# Patient Record
Sex: Female | Born: 1950 | Race: Black or African American | Hispanic: No | Marital: Married | Smoking: Never smoker
Health system: Southern US, Community
[De-identification: ages and names within clinical notes are randomized; demographics above are authoritative.]

## PROBLEM LIST (undated history)

## (undated) DIAGNOSIS — Z9289 Personal history of other medical treatment: Secondary | ICD-10-CM

## (undated) DIAGNOSIS — M1990 Unspecified osteoarthritis, unspecified site: Secondary | ICD-10-CM

## (undated) DIAGNOSIS — E049 Nontoxic goiter, unspecified: Secondary | ICD-10-CM

## (undated) DIAGNOSIS — Z8619 Personal history of other infectious and parasitic diseases: Secondary | ICD-10-CM

## (undated) DIAGNOSIS — I10 Essential (primary) hypertension: Secondary | ICD-10-CM

## (undated) DIAGNOSIS — E119 Type 2 diabetes mellitus without complications: Secondary | ICD-10-CM

## (undated) HISTORY — PX: CHOLECYSTECTOMY: SHX55

## (undated) HISTORY — PX: APPENDECTOMY: SHX54

---

## 1977-06-15 HISTORY — PX: EXPLORATORY LAPAROTOMY: SUR591

## 1998-06-15 HISTORY — PX: OTHER SURGICAL HISTORY: SHX169

## 2009-06-15 HISTORY — PX: KNEE SURGERY: SHX244

## 2009-06-15 HISTORY — PX: DILATION AND CURETTAGE OF UTERUS: SHX78

## 2011-06-16 DIAGNOSIS — Z8619 Personal history of other infectious and parasitic diseases: Secondary | ICD-10-CM

## 2011-06-16 HISTORY — DX: Personal history of other infectious and parasitic diseases: Z86.19

## 2014-01-09 ENCOUNTER — Other Ambulatory Visit: Payer: Self-pay | Admitting: Orthopedic Surgery

## 2014-01-10 DIAGNOSIS — E049 Nontoxic goiter, unspecified: Secondary | ICD-10-CM

## 2014-01-10 HISTORY — DX: Nontoxic goiter, unspecified: E04.9

## 2014-01-17 ENCOUNTER — Encounter (HOSPITAL_COMMUNITY): Payer: Self-pay

## 2014-01-19 ENCOUNTER — Encounter (HOSPITAL_COMMUNITY)
Admission: RE | Admit: 2014-01-19 | Discharge: 2014-01-19 | Disposition: A | Discharge status: Home / Self Care | Payer: Medicare HMO | Source: Ambulatory Visit | Attending: Orthopedic Surgery | Admitting: Orthopedic Surgery

## 2014-01-19 ENCOUNTER — Encounter (HOSPITAL_COMMUNITY): Payer: Self-pay

## 2014-01-19 DIAGNOSIS — Z01812 Encounter for preprocedural laboratory examination: Secondary | ICD-10-CM | POA: Insufficient documentation

## 2014-01-19 DIAGNOSIS — Z01818 Encounter for other preprocedural examination: Secondary | ICD-10-CM | POA: Insufficient documentation

## 2014-01-19 HISTORY — DX: Unspecified osteoarthritis, unspecified site: M19.90

## 2014-01-19 HISTORY — DX: Personal history of other infectious and parasitic diseases: Z86.19

## 2014-01-19 HISTORY — DX: Nontoxic goiter, unspecified: E04.9

## 2014-01-19 HISTORY — DX: Type 2 diabetes mellitus without complications: E11.9

## 2014-01-19 HISTORY — DX: Personal history of other medical treatment: Z92.89

## 2014-01-19 LAB — APTT: aPTT: 31 seconds (ref 24–37)

## 2014-01-19 LAB — CBC WITH DIFFERENTIAL/PLATELET
Basophils Absolute: 0 10*3/uL (ref 0.0–0.1)
Basophils Relative: 0 % (ref 0–1)
Eosinophils Absolute: 0.2 10*3/uL (ref 0.0–0.7)
Eosinophils Relative: 2 % (ref 0–5)
HCT: 39.2 % (ref 36.0–46.0)
HEMOGLOBIN: 13.3 g/dL (ref 12.0–15.0)
LYMPHS ABS: 3.9 10*3/uL (ref 0.7–4.0)
LYMPHS PCT: 43 % (ref 12–46)
MCH: 29.2 pg (ref 26.0–34.0)
MCHC: 33.9 g/dL (ref 30.0–36.0)
MCV: 86 fL (ref 78.0–100.0)
Monocytes Absolute: 0.5 10*3/uL (ref 0.1–1.0)
Monocytes Relative: 5 % (ref 3–12)
NEUTROS PCT: 50 % (ref 43–77)
Neutro Abs: 4.6 10*3/uL (ref 1.7–7.7)
Platelets: 383 10*3/uL (ref 150–400)
RBC: 4.56 MIL/uL (ref 3.87–5.11)
RDW: 13.2 % (ref 11.5–15.5)
WBC: 9.2 10*3/uL (ref 4.0–10.5)

## 2014-01-19 LAB — URINALYSIS, ROUTINE W REFLEX MICROSCOPIC
Bilirubin Urine: NEGATIVE
GLUCOSE, UA: NEGATIVE mg/dL
Hgb urine dipstick: NEGATIVE
KETONES UR: NEGATIVE mg/dL
LEUKOCYTES UA: NEGATIVE
Nitrite: NEGATIVE
PH: 5 (ref 5.0–8.0)
Protein, ur: NEGATIVE mg/dL
Specific Gravity, Urine: 1.01 (ref 1.005–1.030)
Urobilinogen, UA: 0.2 mg/dL (ref 0.0–1.0)

## 2014-01-19 LAB — COMPREHENSIVE METABOLIC PANEL
ALT: 28 U/L (ref 0–35)
AST: 24 U/L (ref 0–37)
Albumin: 4.3 g/dL (ref 3.5–5.2)
Alkaline Phosphatase: 132 U/L — ABNORMAL HIGH (ref 39–117)
Anion gap: 14 (ref 5–15)
BUN: 10 mg/dL (ref 6–23)
CHLORIDE: 99 meq/L (ref 96–112)
CO2: 25 meq/L (ref 19–32)
Calcium: 11.6 mg/dL — ABNORMAL HIGH (ref 8.4–10.5)
Creatinine, Ser: 0.84 mg/dL (ref 0.50–1.10)
GFR calc Af Amer: 83 mL/min — ABNORMAL LOW (ref >90)
GFR, EST NON AFRICAN AMERICAN: 71 mL/min — AB (ref >90)
GLUCOSE: 134 mg/dL — AB (ref 70–99)
Potassium: 4.3 mEq/L (ref 3.7–5.3)
SODIUM: 138 meq/L (ref 137–147)
TOTAL PROTEIN: 8.2 g/dL (ref 6.0–8.3)
Total Bilirubin: 0.2 mg/dL — ABNORMAL LOW (ref 0.3–1.2)

## 2014-01-19 LAB — TYPE AND SCREEN
ABO/RH(D): O POS
Antibody Screen: NEGATIVE

## 2014-01-19 LAB — ABO/RH: ABO/RH(D): O POS

## 2014-01-19 LAB — PROTIME-INR
INR: 1.03 (ref 0.00–1.49)
Prothrombin Time: 13.5 seconds (ref 11.6–15.2)

## 2014-01-19 LAB — SURGICAL PCR SCREEN
MRSA, PCR: NEGATIVE
Staphylococcus aureus: NEGATIVE

## 2014-01-19 NOTE — Pre-Procedure Instructions (Signed)
Ashlee Rodriguez  01/19/2014   Your procedure is scheduled on:  01/29/2014  [facility]  [facility] A at  9:15 AM.  Call this number if you have problems the morning of surgery: [phone]   Remember:   Do not eat food or drink liquids after midnight.  On Sunday P.M.    Take these medicines the morning of surgery with A SIP OF WATER: Amlodipine, Clonidine   Do not wear jewelry, make-up or nail polish.  Do not wear lotions, powders, or perfumes. You may wear deodorant.  Do not shave 48 hours prior to surgery.   Do not bring valuables to the hospital.  [facility] is not responsible  for any belongings or valuables.               Contacts, dentures or bridgework may not be worn into surgery.  Leave suitcase in the car. After surgery it may be brought to your room.  For patients admitted to the hospital, discharge time is determined by your                treatment team.               Patients discharged the day of surgery will not be allowed to drive  home.  Name and phone number of your driver: /w spouse   Special Instructions: Special Instructions: [place] - Preparing for Surgery  Before surgery, you can play an important role.  Because skin is not sterile, your skin needs to be as free of germs as possible.  You can reduce the number of germs on you skin by washing with CHG (chlorahexidine gluconate) soap before surgery.  CHG is an antiseptic cleaner which kills germs and bonds with the skin to continue killing germs even after washing.  Please DO NOT use if you have an allergy to CHG or antibacterial soaps.  If your skin becomes reddened/irritated stop using the CHG and inform your nurse when you arrive at Short Stay.  Do not shave (including legs and underarms) for at least 48 hours prior to the first CHG shower.  You may shave your face.  Please follow these instructions carefully:   1.  Shower with CHG Soap the night before surgery and the   morning of Surgery.  2.  If you choose to wash your hair, wash your hair first as usual with your  normal shampoo.  3.  After you shampoo, rinse your hair and body thoroughly to remove the  Shampoo.  4.  Use CHG as you would any other liquid soap.  You can apply chg directly to the skin and wash gently with scrungie or a clean washcloth.  5.  Apply the CHG Soap to your body ONLY FROM THE NECK DOWN.    Do not use on open wounds or open sores.  Avoid contact with your eyes, ears, mouth and genitals (private parts).  Wash genitals (private parts)   with your normal soap.  6.  Wash thoroughly, paying special attention to the area where your surgery will be performed.  7.  Thoroughly rinse your body with warm water from the neck down.  8.  DO NOT shower/wash with your normal soap after using and rinsing off   the CHG Soap.  9.  Pat yourself dry with a clean towel.            10 .  Wear clean pajamas.  11.  Place clean sheets on your bed the night of your first shower and do not sleep with pets.  Day of Surgery  Do not apply any lotions/deodorants the morning of surgery.  Please wear clean clothes to the hospital/surgery center.   Please read over the following fact sheets that you were given: Pain Booklet, Coughing and Deep Breathing, Blood Transfusion Information, Total Joint Packet, MRSA Information and Surgical Site Infection Prevention

## 2014-01-21 LAB — URINE CULTURE: Colony Count: 60000

## 2014-01-22 ENCOUNTER — Other Ambulatory Visit (HOSPITAL_COMMUNITY): Payer: Self-pay

## 2014-01-28 MED ORDER — CHLORHEXIDINE GLUCONATE 4 % EX LIQD
60.0000 mL | Freq: Once | CUTANEOUS | Status: DC
  Filled 2014-01-28: qty 60

## 2014-01-28 MED ORDER — DEXTROSE 5 % IV SOLN
3.0000 g | INTRAVENOUS | Status: AC
  Administered 2014-01-29: 3 g via INTRAVENOUS
  Filled 2014-01-28: qty 3000

## 2014-01-28 MED ORDER — SODIUM CHLORIDE 0.9 % IV SOLN: INTRAVENOUS | Status: DC

## 2014-01-28 MED ORDER — TRANEXAMIC ACID 100 MG/ML IV SOLN
1000.0000 mg | INTRAVENOUS | Status: AC
  Administered 2014-01-29: 1000 mg via INTRAVENOUS
  Filled 2014-01-28: qty 10

## 2014-01-29 ENCOUNTER — Encounter (HOSPITAL_COMMUNITY): Payer: Medicare HMO | Admitting: Vascular Surgery

## 2014-01-29 ENCOUNTER — Encounter (HOSPITAL_COMMUNITY)
Admission: RE | Disposition: A | Discharge status: Home Health | Payer: Self-pay | Source: Ambulatory Visit | Attending: Orthopedic Surgery

## 2014-01-29 ENCOUNTER — Inpatient Hospital Stay (HOSPITAL_COMMUNITY)
Admission: RE | Admit: 2014-01-29 | Discharge: 2014-01-31 | DRG: 470 | Disposition: A | Discharge status: Home Health | Payer: Medicare HMO | Source: Ambulatory Visit | Attending: Orthopedic Surgery | Admitting: Orthopedic Surgery

## 2014-01-29 ENCOUNTER — Inpatient Hospital Stay (HOSPITAL_COMMUNITY): Payer: Medicare HMO | Admitting: Anesthesiology

## 2014-01-29 ENCOUNTER — Encounter (HOSPITAL_COMMUNITY): Payer: Self-pay | Admitting: *Deleted

## 2014-01-29 DIAGNOSIS — Z9089 Acquired absence of other organs: Secondary | ICD-10-CM

## 2014-01-29 DIAGNOSIS — E119 Type 2 diabetes mellitus without complications: Secondary | ICD-10-CM | POA: Diagnosis present

## 2014-01-29 DIAGNOSIS — M1710 Unilateral primary osteoarthritis, unspecified knee: Secondary | ICD-10-CM | POA: Diagnosis present

## 2014-01-29 DIAGNOSIS — R112 Nausea with vomiting, unspecified: Secondary | ICD-10-CM | POA: Diagnosis not present

## 2014-01-29 DIAGNOSIS — Z96652 Presence of left artificial knee joint: Secondary | ICD-10-CM

## 2014-01-29 DIAGNOSIS — D62 Acute posthemorrhagic anemia: Secondary | ICD-10-CM | POA: Diagnosis not present

## 2014-01-29 DIAGNOSIS — I10 Essential (primary) hypertension: Secondary | ICD-10-CM | POA: Diagnosis present

## 2014-01-29 DIAGNOSIS — Z6841 Body Mass Index (BMI) 40.0 and over, adult: Secondary | ICD-10-CM | POA: Diagnosis not present

## 2014-01-29 DIAGNOSIS — M25569 Pain in unspecified knee: Secondary | ICD-10-CM | POA: Diagnosis present

## 2014-01-29 DIAGNOSIS — Z96659 Presence of unspecified artificial knee joint: Secondary | ICD-10-CM

## 2014-01-29 HISTORY — PX: TOTAL KNEE ARTHROPLASTY: SHX125

## 2014-01-29 HISTORY — DX: Essential (primary) hypertension: I10

## 2014-01-29 LAB — GLUCOSE, CAPILLARY
Glucose-Capillary: 119 mg/dL — ABNORMAL HIGH (ref 70–99)
Glucose-Capillary: 177 mg/dL — ABNORMAL HIGH (ref 70–99)
Glucose-Capillary: 168 mg/dL — ABNORMAL HIGH (ref 70–99)
Glucose-Capillary: 199 mg/dL — ABNORMAL HIGH (ref 70–99)

## 2014-01-29 LAB — CBC
HEMATOCRIT: 39.5 % (ref 36.0–46.0)
Hemoglobin: 13.6 g/dL (ref 12.0–15.0)
MCH: 29.6 pg (ref 26.0–34.0)
MCHC: 34.4 g/dL (ref 30.0–36.0)
MCV: 86.1 fL (ref 78.0–100.0)
PLATELETS: 358 10*3/uL (ref 150–400)
RBC: 4.59 MIL/uL (ref 3.87–5.11)
RDW: 13.1 % (ref 11.5–15.5)
WBC: 18.6 10*3/uL — ABNORMAL HIGH (ref 4.0–10.5)

## 2014-01-29 LAB — CREATININE, SERUM
Creatinine, Ser: 0.79 mg/dL (ref 0.50–1.10)
GFR calc Af Amer: >90 mL/min (ref >90)
GFR calc non Af Amer: 86 mL/min — ABNORMAL LOW (ref >90)

## 2014-01-29 SURGERY — ARTHROPLASTY, KNEE, TOTAL
Anesthesia: Regional | Site: Knee | Laterality: Left

## 2014-01-29 MED ORDER — ENOXAPARIN SODIUM 30 MG/0.3ML ~~LOC~~ SOLN
30.0000 mg | Freq: Two times a day (BID) | SUBCUTANEOUS | Status: DC
  Administered 2014-01-30 – 2014-01-31 (×3): 30 mg via SUBCUTANEOUS
  Filled 2014-01-29 (×6): qty 0.3

## 2014-01-29 MED ORDER — BUPIVACAINE-EPINEPHRINE 0.5% -1:200000 IJ SOLN
INTRAMUSCULAR | Status: DC | PRN
  Administered 2014-01-29: 30 mL

## 2014-01-29 MED ORDER — FENTANYL CITRATE 0.05 MG/ML IJ SOLN
INTRAMUSCULAR | Status: AC
  Administered 2014-01-29: 75 ug
  Filled 2014-01-29: qty 2

## 2014-01-29 MED ORDER — ROCURONIUM BROMIDE 50 MG/5ML IV SOLN
INTRAVENOUS | Status: AC
  Filled 2014-01-29: qty 1

## 2014-01-29 MED ORDER — METOCLOPRAMIDE HCL 5 MG/ML IJ SOLN: 5.0000 mg | Freq: Three times a day (TID) | INTRAMUSCULAR | Status: DC | PRN

## 2014-01-29 MED ORDER — SODIUM CHLORIDE 0.9 % IV SOLN
INTRAVENOUS | Status: DC
  Administered 2014-01-29: 18:00:00 via INTRAVENOUS

## 2014-01-29 MED ORDER — ALUM & MAG HYDROXIDE-SIMETH 200-200-20 MG/5ML PO SUSP: 30.0000 mL | ORAL | Status: DC | PRN

## 2014-01-29 MED ORDER — FENTANYL CITRATE 0.05 MG/ML IJ SOLN
25.0000 ug | INTRAMUSCULAR | Status: DC | PRN
Start: 2014-01-29 — End: 2014-01-29
  Administered 2014-01-29 (×2): 50 ug via INTRAVENOUS

## 2014-01-29 MED ORDER — METOCLOPRAMIDE HCL 10 MG PO TABS: 5.0000 mg | ORAL_TABLET | Freq: Three times a day (TID) | ORAL | Status: DC | PRN

## 2014-01-29 MED ORDER — INSULIN ASPART 100 UNIT/ML ~~LOC~~ SOLN: 0.0000 [IU] | Freq: Every day | SUBCUTANEOUS | Status: DC

## 2014-01-29 MED ORDER — SENNOSIDES-DOCUSATE SODIUM 8.6-50 MG PO TABS: 1.0000 | ORAL_TABLET | Freq: Every evening | ORAL | Status: DC | PRN

## 2014-01-29 MED ORDER — AMLODIPINE BESYLATE 10 MG PO TABS
10.0000 mg | ORAL_TABLET | Freq: Every day | ORAL | Status: DC
  Administered 2014-01-30 – 2014-01-31 (×2): 10 mg via ORAL
  Filled 2014-01-29 (×3): qty 1

## 2014-01-29 MED ORDER — LABETALOL HCL 5 MG/ML IV SOLN
INTRAVENOUS | Status: DC | PRN
  Administered 2014-01-29 (×2): 5 mg via INTRAVENOUS

## 2014-01-29 MED ORDER — FENTANYL CITRATE 0.05 MG/ML IJ SOLN
INTRAMUSCULAR | Status: AC
  Administered 2014-01-29: 50 ug via INTRAVENOUS
  Filled 2014-01-29: qty 2

## 2014-01-29 MED ORDER — MENTHOL 3 MG MT LOZG: 1.0000 | LOZENGE | OROMUCOSAL | Status: DC | PRN

## 2014-01-29 MED ORDER — ZOLPIDEM TARTRATE 5 MG PO TABS: 5.0000 mg | ORAL_TABLET | Freq: Every evening | ORAL | Status: DC | PRN

## 2014-01-29 MED ORDER — BUPIVACAINE-EPINEPHRINE (PF) 0.5% -1:200000 IJ SOLN
INTRAMUSCULAR | Status: AC
  Filled 2014-01-29: qty 30

## 2014-01-29 MED ORDER — OXYCODONE HCL ER 10 MG PO T12A
10.0000 mg | EXTENDED_RELEASE_TABLET | Freq: Two times a day (BID) | ORAL | Status: DC
  Administered 2014-01-29 – 2014-01-31 (×4): 10 mg via ORAL
  Filled 2014-01-29 (×4): qty 1

## 2014-01-29 MED ORDER — BISACODYL 5 MG PO TBEC: 5.0000 mg | DELAYED_RELEASE_TABLET | Freq: Every day | ORAL | Status: DC | PRN

## 2014-01-29 MED ORDER — DIPHENHYDRAMINE HCL 50 MG/ML IJ SOLN
INTRAMUSCULAR | Status: DC | PRN
  Administered 2014-01-29: 12.5 mg via INTRAVENOUS

## 2014-01-29 MED ORDER — SCOPOLAMINE 1 MG/3DAYS TD PT72
1.0000 | MEDICATED_PATCH | TRANSDERMAL | Status: DC
  Administered 2014-01-29: 1 via TRANSDERMAL

## 2014-01-29 MED ORDER — GLIMEPIRIDE 4 MG PO TABS
4.0000 mg | ORAL_TABLET | Freq: Two times a day (BID) | ORAL | Status: DC
  Administered 2014-01-29 – 2014-01-31 (×4): 4 mg via ORAL
  Filled 2014-01-29 (×7): qty 1

## 2014-01-29 MED ORDER — NEOSTIGMINE METHYLSULFATE 10 MG/10ML IV SOLN
INTRAVENOUS | Status: AC
Start: 2014-01-29 — End: 2014-01-29
  Filled 2014-01-29: qty 1

## 2014-01-29 MED ORDER — CELECOXIB 200 MG PO CAPS
200.0000 mg | ORAL_CAPSULE | Freq: Two times a day (BID) | ORAL | Status: DC
  Administered 2014-01-29 – 2014-01-31 (×4): 200 mg via ORAL
  Filled 2014-01-29 (×6): qty 1

## 2014-01-29 MED ORDER — OXYCODONE HCL 5 MG PO TABS
5.0000 mg | ORAL_TABLET | ORAL | Status: DC | PRN
  Administered 2014-01-29: 5 mg via ORAL
  Administered 2014-01-30: 10 mg via ORAL
  Administered 2014-01-30: 5 mg via ORAL
  Administered 2014-01-30 – 2014-01-31 (×6): 10 mg via ORAL
  Filled 2014-01-29 (×5): qty 2
  Filled 2014-01-29: qty 1
  Filled 2014-01-29 (×2): qty 2

## 2014-01-29 MED ORDER — FENTANYL CITRATE 0.05 MG/ML IJ SOLN
INTRAMUSCULAR | Status: AC
  Filled 2014-01-29: qty 5

## 2014-01-29 MED ORDER — ALBUTEROL SULFATE HFA 108 (90 BASE) MCG/ACT IN AERS
INHALATION_SPRAY | RESPIRATORY_TRACT | Status: AC
  Filled 2014-01-29: qty 6.7

## 2014-01-29 MED ORDER — GLYCOPYRROLATE 0.2 MG/ML IJ SOLN
INTRAMUSCULAR | Status: DC | PRN
  Administered 2014-01-29: 0.6 mg via INTRAVENOUS
  Administered 2014-01-29: 0.2 mg via INTRAVENOUS

## 2014-01-29 MED ORDER — CLONIDINE HCL 0.3 MG PO TABS
0.3000 mg | ORAL_TABLET | Freq: Two times a day (BID) | ORAL | Status: DC
  Administered 2014-01-29 – 2014-01-31 (×4): 0.3 mg via ORAL
  Filled 2014-01-29 (×5): qty 1

## 2014-01-29 MED ORDER — FENTANYL CITRATE 0.05 MG/ML IJ SOLN
INTRAMUSCULAR | Status: DC | PRN
  Administered 2014-01-29: 50 ug via INTRAVENOUS
  Administered 2014-01-29: 250 ug via INTRAVENOUS
  Administered 2014-01-29 (×2): 100 ug via INTRAVENOUS

## 2014-01-29 MED ORDER — SIMVASTATIN 10 MG PO TABS
10.0000 mg | ORAL_TABLET | Freq: Every day | ORAL | Status: DC
Start: 2014-01-29 — End: 2014-01-31
  Administered 2014-01-29 – 2014-01-30 (×2): 10 mg via ORAL
  Filled 2014-01-29 (×3): qty 1

## 2014-01-29 MED ORDER — ATROPINE SULFATE 1 MG/ML IJ SOLN
INTRAMUSCULAR | Status: DC | PRN
  Administered 2014-01-29: .3 mg via INTRAVENOUS

## 2014-01-29 MED ORDER — LIDOCAINE HCL (CARDIAC) 20 MG/ML IV SOLN
INTRAVENOUS | Status: DC | PRN
  Administered 2014-01-29: 30 mg via INTRAVENOUS

## 2014-01-29 MED ORDER — OXYCODONE HCL 5 MG PO TABS
ORAL_TABLET | ORAL | Status: AC
  Administered 2014-01-29: 5 mg via ORAL
  Filled 2014-01-29: qty 1

## 2014-01-29 MED ORDER — PROMETHAZINE HCL 25 MG/ML IJ SOLN: 6.2500 mg | INTRAMUSCULAR | Status: DC | PRN

## 2014-01-29 MED ORDER — ONDANSETRON HCL 4 MG/2ML IJ SOLN
4.0000 mg | Freq: Four times a day (QID) | INTRAMUSCULAR | Status: DC | PRN
  Administered 2014-01-30: 4 mg via INTRAVENOUS
  Filled 2014-01-29: qty 2

## 2014-01-29 MED ORDER — EPHEDRINE SULFATE 50 MG/ML IJ SOLN
INTRAMUSCULAR | Status: DC | PRN
  Administered 2014-01-29: 15 mg via INTRAVENOUS
  Administered 2014-01-29: 10 mg via INTRAVENOUS

## 2014-01-29 MED ORDER — ACETAMINOPHEN 325 MG PO TABS: 650.0000 mg | ORAL_TABLET | Freq: Four times a day (QID) | ORAL | Status: DC | PRN

## 2014-01-29 MED ORDER — LACTATED RINGERS IV SOLN
INTRAVENOUS | Status: DC | PRN
  Administered 2014-01-29: 11:00:00 via INTRAVENOUS

## 2014-01-29 MED ORDER — DOCUSATE SODIUM 100 MG PO CAPS
100.0000 mg | ORAL_CAPSULE | Freq: Two times a day (BID) | ORAL | Status: DC
  Administered 2014-01-29 – 2014-01-31 (×4): 100 mg via ORAL
  Filled 2014-01-29 (×6): qty 1

## 2014-01-29 MED ORDER — MIDAZOLAM HCL 2 MG/2ML IJ SOLN
INTRAMUSCULAR | Status: AC
  Administered 2014-01-29: 1.5 mg
  Filled 2014-01-29: qty 2

## 2014-01-29 MED ORDER — ATROPINE SULFATE 0.1 MG/ML IJ SOLN
INTRAMUSCULAR | Status: AC
  Filled 2014-01-29: qty 10

## 2014-01-29 MED ORDER — SODIUM CHLORIDE 0.9 % IR SOLN
Status: DC | PRN
  Administered 2014-01-29: 1

## 2014-01-29 MED ORDER — LIDOCAINE HCL (CARDIAC) 20 MG/ML IV SOLN
INTRAVENOUS | Status: AC
  Filled 2014-01-29: qty 5

## 2014-01-29 MED ORDER — MIDAZOLAM HCL 2 MG/2ML IJ SOLN
INTRAMUSCULAR | Status: AC
  Filled 2014-01-29: qty 2

## 2014-01-29 MED ORDER — LABETALOL HCL 5 MG/ML IV SOLN
INTRAVENOUS | Status: AC
  Filled 2014-01-29: qty 4

## 2014-01-29 MED ORDER — PROPOFOL 10 MG/ML IV BOLUS
INTRAVENOUS | Status: AC
  Filled 2014-01-29: qty 20

## 2014-01-29 MED ORDER — WHITE PETROLATUM GEL
Status: AC
  Administered 2014-01-29: 23:00:00
  Filled 2014-01-29: qty 5

## 2014-01-29 MED ORDER — DIPHENHYDRAMINE HCL 12.5 MG/5ML PO ELIX
12.5000 mg | ORAL_SOLUTION | ORAL | Status: DC | PRN
Start: 2014-01-29 — End: 2014-01-31

## 2014-01-29 MED ORDER — CINACALCET HCL 30 MG PO TABS
30.0000 mg | ORAL_TABLET | Freq: Two times a day (BID) | ORAL | Status: DC
  Administered 2014-01-29 – 2014-01-31 (×4): 30 mg via ORAL
  Filled 2014-01-29 (×7): qty 1

## 2014-01-29 MED ORDER — BUPIVACAINE LIPOSOME 1.3 % IJ SUSP
20.0000 mL | Freq: Once | INTRAMUSCULAR | Status: AC
  Administered 2014-01-29: 20 mL
  Filled 2014-01-29: qty 20

## 2014-01-29 MED ORDER — CEFAZOLIN SODIUM-DEXTROSE 2-3 GM-% IV SOLR
2.0000 g | Freq: Four times a day (QID) | INTRAVENOUS | Status: AC
  Administered 2014-01-29 (×2): 2 g via INTRAVENOUS
  Filled 2014-01-29 (×2): qty 50

## 2014-01-29 MED ORDER — METHOCARBAMOL 1000 MG/10ML IJ SOLN
500.0000 mg | Freq: Four times a day (QID) | INTRAMUSCULAR | Status: DC | PRN
  Filled 2014-01-29: qty 5

## 2014-01-29 MED ORDER — ONDANSETRON HCL 4 MG PO TABS: 4.0000 mg | ORAL_TABLET | Freq: Four times a day (QID) | ORAL | Status: DC | PRN

## 2014-01-29 MED ORDER — METHOCARBAMOL 500 MG PO TABS
500.0000 mg | ORAL_TABLET | Freq: Four times a day (QID) | ORAL | Status: DC | PRN
  Administered 2014-01-30 – 2014-01-31 (×4): 500 mg via ORAL
  Filled 2014-01-29 (×4): qty 1

## 2014-01-29 MED ORDER — ROCURONIUM BROMIDE 100 MG/10ML IV SOLN
INTRAVENOUS | Status: DC | PRN
  Administered 2014-01-29: 50 mg via INTRAVENOUS

## 2014-01-29 MED ORDER — DIPHENHYDRAMINE HCL 50 MG/ML IJ SOLN
INTRAMUSCULAR | Status: AC
  Filled 2014-01-29: qty 1

## 2014-01-29 MED ORDER — DEXAMETHASONE SODIUM PHOSPHATE 4 MG/ML IJ SOLN
INTRAMUSCULAR | Status: DC | PRN
  Administered 2014-01-29: 4 mg via INTRAVENOUS

## 2014-01-29 MED ORDER — PROPOFOL 10 MG/ML IV BOLUS
INTRAVENOUS | Status: DC | PRN
  Administered 2014-01-29: 200 mg via INTRAVENOUS

## 2014-01-29 MED ORDER — INSULIN ASPART 100 UNIT/ML ~~LOC~~ SOLN
0.0000 [IU] | Freq: Three times a day (TID) | SUBCUTANEOUS | Status: DC
  Administered 2014-01-29 – 2014-01-30 (×2): 3 [IU] via SUBCUTANEOUS
  Administered 2014-01-30: 5 [IU] via SUBCUTANEOUS
  Administered 2014-01-30 – 2014-01-31 (×3): 3 [IU] via SUBCUTANEOUS

## 2014-01-29 MED ORDER — LINAGLIPTIN 5 MG PO TABS
5.0000 mg | ORAL_TABLET | Freq: Every day | ORAL | Status: DC
  Administered 2014-01-29 – 2014-01-31 (×3): 5 mg via ORAL
  Filled 2014-01-29 (×3): qty 1

## 2014-01-29 MED ORDER — FLEET ENEMA 7-19 GM/118ML RE ENEM
1.0000 | ENEMA | Freq: Once | RECTAL | Status: AC | PRN
Start: 2014-01-29 — End: 2014-01-29

## 2014-01-29 MED ORDER — LACTATED RINGERS IV SOLN
INTRAVENOUS | Status: DC
  Administered 2014-01-29: 10:00:00 via INTRAVENOUS

## 2014-01-29 MED ORDER — MEPERIDINE HCL 25 MG/ML IJ SOLN: 6.2500 mg | INTRAMUSCULAR | Status: DC | PRN

## 2014-01-29 MED ORDER — GLYCOPYRROLATE 0.2 MG/ML IJ SOLN
INTRAMUSCULAR | Status: AC
  Filled 2014-01-29: qty 3

## 2014-01-29 MED ORDER — METFORMIN HCL 500 MG PO TABS
1000.0000 mg | ORAL_TABLET | Freq: Two times a day (BID) | ORAL | Status: DC
  Administered 2014-01-29 – 2014-01-31 (×4): 1000 mg via ORAL
  Filled 2014-01-29 (×6): qty 2

## 2014-01-29 MED ORDER — ONDANSETRON HCL 4 MG/2ML IJ SOLN
INTRAMUSCULAR | Status: AC
  Filled 2014-01-29: qty 2

## 2014-01-29 MED ORDER — ACETAMINOPHEN 650 MG RE SUPP: 650.0000 mg | Freq: Four times a day (QID) | RECTAL | Status: DC | PRN

## 2014-01-29 MED ORDER — DEXAMETHASONE SODIUM PHOSPHATE 4 MG/ML IJ SOLN
INTRAMUSCULAR | Status: AC
  Filled 2014-01-29: qty 1

## 2014-01-29 MED ORDER — ONDANSETRON HCL 4 MG/2ML IJ SOLN
INTRAMUSCULAR | Status: DC | PRN
  Administered 2014-01-29: 4 mg via INTRAVENOUS

## 2014-01-29 MED ORDER — HYDROMORPHONE HCL PF 1 MG/ML IJ SOLN
1.0000 mg | INTRAMUSCULAR | Status: DC | PRN
  Administered 2014-01-29 – 2014-01-30 (×4): 1 mg via INTRAVENOUS
  Filled 2014-01-29 (×4): qty 1

## 2014-01-29 MED ORDER — QUINAPRIL HCL 10 MG PO TABS
40.0000 mg | ORAL_TABLET | Freq: Every day | ORAL | Status: DC
  Administered 2014-01-30 – 2014-01-31 (×2): 40 mg via ORAL
  Filled 2014-01-29 (×4): qty 4

## 2014-01-29 MED ORDER — PHENOL 1.4 % MT LIQD: 1.0000 | OROMUCOSAL | Status: DC | PRN

## 2014-01-29 MED ORDER — NEOSTIGMINE METHYLSULFATE 10 MG/10ML IV SOLN
INTRAVENOUS | Status: DC | PRN
  Administered 2014-01-29: 4 mg via INTRAVENOUS

## 2014-01-29 SURGICAL SUPPLY — 57 items
BANDAGE ESMARK 6X9 LF (GAUZE/BANDAGES/DRESSINGS) ×1 IMPLANT
BLADE SAGITTAL 13X1.27X60 (BLADE) ×2 IMPLANT
BLADE SAGITTAL 13X1.27X60MM (BLADE) ×1
BLADE SAW SGTL 83.5X18.5 (BLADE) ×3 IMPLANT
BNDG ELASTIC 6X15 VLCR STRL LF (GAUZE/BANDAGES/DRESSINGS) ×3 IMPLANT
BNDG ESMARK 6X9 LF (GAUZE/BANDAGES/DRESSINGS) ×3
BOWL SMART MIX CTS (DISPOSABLE) ×3 IMPLANT
CAP POR NKTM CP VIT E LN CER ×3 IMPLANT
CEMENT BONE SIMPLEX SPEEDSET (Cement) ×6 IMPLANT
COVER SURGICAL LIGHT HANDLE (MISCELLANEOUS) ×3 IMPLANT
CUFF TOURNIQUET SINGLE 34IN LL (TOURNIQUET CUFF) ×3 IMPLANT
DRAPE EXTREMITY T 121X128X90 (DRAPE) ×3 IMPLANT
DRAPE INCISE IOBAN 66X45 STRL (DRAPES) ×6 IMPLANT
DRAPE PROXIMA HALF (DRAPES) ×3 IMPLANT
DRAPE U-SHAPE 47X51 STRL (DRAPES) ×3 IMPLANT
DRSG ADAPTIC 3X8 NADH LF (GAUZE/BANDAGES/DRESSINGS) ×3 IMPLANT
DRSG PAD ABDOMINAL 8X10 ST (GAUZE/BANDAGES/DRESSINGS) ×3 IMPLANT
DURAPREP 26ML APPLICATOR (WOUND CARE) ×6 IMPLANT
ELECT REM PT RETURN 9FT ADLT (ELECTROSURGICAL) ×3
ELECTRODE REM PT RTRN 9FT ADLT (ELECTROSURGICAL) ×1 IMPLANT
EVACUATOR 1/8 PVC DRAIN (DRAIN) ×3 IMPLANT
GAUZE SPONGE 4X4 12PLY STRL (GAUZE/BANDAGES/DRESSINGS) ×3 IMPLANT
GAUZE SPONGE 4X4 16PLY XRAY LF (GAUZE/BANDAGES/DRESSINGS) ×3 IMPLANT
GLOVE BIOGEL M 7.0 STRL (GLOVE) IMPLANT
GLOVE BIOGEL PI IND STRL 7.5 (GLOVE) ×3 IMPLANT
GLOVE BIOGEL PI IND STRL 8.5 (GLOVE) ×2 IMPLANT
GLOVE BIOGEL PI INDICATOR 7.5 (GLOVE) ×6
GLOVE BIOGEL PI INDICATOR 8.5 (GLOVE) ×4
GLOVE SURG ORTHO 8.0 STRL STRW (GLOVE) ×6 IMPLANT
GOWN STRL REUS W/ TWL LRG LVL3 (GOWN DISPOSABLE) ×2 IMPLANT
GOWN STRL REUS W/ TWL XL LVL3 (GOWN DISPOSABLE) ×2 IMPLANT
GOWN STRL REUS W/TWL LRG LVL3 (GOWN DISPOSABLE) ×4
GOWN STRL REUS W/TWL XL LVL3 (GOWN DISPOSABLE) ×4
HANDPIECE INTERPULSE COAX TIP (DISPOSABLE) ×2
HOOD PEEL AWAY FACE SHEILD DIS (HOOD) ×12 IMPLANT
KIT BASIN OR (CUSTOM PROCEDURE TRAY) ×3 IMPLANT
KIT ROOM TURNOVER OR (KITS) ×3 IMPLANT
MANIFOLD NEPTUNE II (INSTRUMENTS) ×3 IMPLANT
NEEDLE 22X1 1/2 (OR ONLY) (NEEDLE) ×6 IMPLANT
NS IRRIG 1000ML POUR BTL (IV SOLUTION) ×3 IMPLANT
PACK TOTAL JOINT (CUSTOM PROCEDURE TRAY) ×3 IMPLANT
PAD ARMBOARD 7.5X6 YLW CONV (MISCELLANEOUS) ×6 IMPLANT
PADDING CAST COTTON 6X4 STRL (CAST SUPPLIES) ×3 IMPLANT
SET HNDPC FAN SPRY TIP SCT (DISPOSABLE) ×1 IMPLANT
STAPLER VISISTAT 35W (STAPLE) ×3 IMPLANT
SUCTION FRAZIER TIP 10 FR DISP (SUCTIONS) ×3 IMPLANT
SUT BONE WAX W31G (SUTURE) ×3 IMPLANT
SUT VIC AB 0 CTB1 27 (SUTURE) ×6 IMPLANT
SUT VIC AB 1 CT1 27 (SUTURE) ×4
SUT VIC AB 1 CT1 27XBRD ANBCTR (SUTURE) ×2 IMPLANT
SUT VIC AB 2-0 CT1 27 (SUTURE) ×4
SUT VIC AB 2-0 CT1 TAPERPNT 27 (SUTURE) ×2 IMPLANT
SYR 20CC LL (SYRINGE) ×6 IMPLANT
TOWEL OR 17X24 6PK STRL BLUE (TOWEL DISPOSABLE) ×3 IMPLANT
TOWEL OR 17X26 10 PK STRL BLUE (TOWEL DISPOSABLE) ×3 IMPLANT
TRAY FOLEY CATH 14FR (SET/KITS/TRAYS/PACK) IMPLANT
WATER STERILE IRR 1000ML POUR (IV SOLUTION) ×6 IMPLANT

## 2014-01-29 NOTE — Plan of Care (Signed)
Problem: Consults Goal: Diagnosis- Total Joint Replacement Primary Total Knee Left     

## 2014-01-29 NOTE — Progress Notes (Signed)
Utilization review completed.  

## 2014-01-29 NOTE — Transfer of Care (Signed)
Immediate Anesthesia Transfer of Care Note  Patient: Ashlee Rodriguez  Procedure(s) Performed: Procedure(s): TOTAL KNEE ARTHROPLASTY (Left)  Patient Location: PACU  Anesthesia Type:General and Regional  Level of Consciousness: awake, alert , oriented and sedated  Airway & Oxygen Therapy: Patient Spontanous Breathing and Patient connected to face mask oxygen  Post-op Assessment: Report given to PACU RN, Post -op Vital signs reviewed and stable and Patient moving all extremities  Post vital signs: Reviewed and stable  Complications: No apparent anesthesia complications

## 2014-01-29 NOTE — Anesthesia Preprocedure Evaluation (Addendum)
Anesthesia Evaluation   Patient awake    Reviewed: Allergy & Precautions, H&P , NPO status , Patient's Chart, lab work & pertinent test results  Airway Mallampati: II TM Distance: >3 FB     Dental  (+) Teeth Intact   Pulmonary  breath sounds clear to auscultation        Cardiovascular hypertension, Pt. on medications Rhythm:Regular Rate:Normal     Neuro/Psych    GI/Hepatic   Endo/Other  diabetes, Type 2, Oral Hypoglycemic Agents  Renal/GU      Musculoskeletal   Abdominal (+)  Abdomen: soft.    Peds  Hematology   Anesthesia Other Findings   Reproductive/Obstetrics                          Anesthesia Physical Anesthesia Plan  ASA: III  Anesthesia Plan: General   Post-op Pain Management: MAC Combined w/ Regional for Post-op pain   Induction:   Airway Management Planned: Oral ETT  Additional Equipment:   Intra-op Plan:   Post-operative Plan: Extubation in OR  Informed Consent: I have reviewed the patients History and Physical, chart, labs and discussed the procedure including the risks, benefits and alternatives for the proposed anesthesia with the patient or authorized representative who has indicated his/her understanding and acceptance.     Plan Discussed with:   Anesthesia Plan Comments:         Anesthesia Quick Evaluation

## 2014-01-29 NOTE — Anesthesia Procedure Notes (Addendum)
Anesthesia Regional Block:  Adductor canal block  Pre-Anesthetic Checklist: ,, timeout performed,, Correct Site,, Correct Procedure,, site marked,,, surgical consent,, at surgeon's request   Prep: chloraprep       Needles:   Needle Type: Echogenic Stimulator Needle     Needle Length: 9cm 9 cm Needle Gauge: 21 and 21 G    Additional Needles:  Procedures: ultrasound guided (picture in chart) Adductor canal block Narrative:    Anesthesia Regional Block:   Narrative:

## 2014-01-29 NOTE — Progress Notes (Signed)
Orthopedic Tech Progress Note Patient Details:  [name] [birth year] [id number]  CPM Left Knee CPM Left Knee: On Left Knee Flexion (Degrees): 90 Left Knee Extension (Degrees): 0 Additional Comments: Trapeze bar and foot roll   Shawnie PonsCammer, Aylene Acoff Carol 01/29/2014, 3:31 PM

## 2014-01-29 NOTE — Anesthesia Postprocedure Evaluation (Signed)
  Anesthesia Post-op Note  Patient: Ashlee Rodriguez  Procedure(s) Performed: Procedure(s): TOTAL KNEE ARTHROPLASTY (Left)  Patient Location: PACU  Anesthesia Type:General  Level of Consciousness: awake, alert  and oriented  Airway and Oxygen Therapy: Patient Spontanous Breathing and Patient connected to nasal cannula oxygen  Post-op Pain: mild  Post-op Assessment: Post-op Vital signs reviewed, Patient's Cardiovascular Status Stable and Respiratory Function Stable  Post-op Vital Signs: Reviewed and stable  Last Vitals:  Filed Vitals:   01/29/14 1403  BP: 160/81  Pulse: 89  Temp: 36.4 C  Resp: 19    Complications: No apparent anesthesia complications

## 2014-01-29 NOTE — H&P (Signed)
  Ashlee Rodriguez MRN:  [id number] DOB/SEX:  06/20/1950/female  CHIEF COMPLAINT:  Painful left Knee  HISTORY: Patient is a 63 y.o. female presented with a history of pain in the left knee. Onset of symptoms was gradual starting several years ago with gradually worsening course since that time. Prior procedures on the knee include none. Patient has been treated conservatively with over-the-counter NSAIDs and activity modification. Patient currently rates pain in the knee at 10 out of 10 with activity. There is pain at night.  PAST MEDICAL HISTORY: There are no active problems to display for this patient.  Past Medical History  Diagnosis Date  . History of stress test     & echocardiogram in [place]   . H/O Legionnaire's disease 2013  . Thyroid goiter 01/10/2014    biopsy done & benign  . Diabetes mellitus without complication     Type II  . Arthritis     knees, fingers- OA   Past Surgical History  Procedure Laterality Date  . Parathyr  2000    parathyroidectomy  . Exploratory laparotomy  1979  . Appendectomy    . Cholecystectomy    . Knee surgery Right 2011    for torn meniscus  . Dilation and curettage of uterus  2011    fibroids      MEDICATIONS:   No prescriptions prior to admission    ALLERGIES:   Allergies  Allergen Reactions  . Adhesive [Tape] Rash    REVIEW OF SYSTEMS:  Pertinent items are noted in HPI.   FAMILY HISTORY:  No family history on file.  SOCIAL HISTORY:   History  Substance Use Topics  . Smoking status: Never Smoker   . Smokeless tobacco: Not on file  . Alcohol Use: Yes     Comment: rare glass of wine      EXAMINATION:  Vital signs in last 24 hours:    General appearance: alert, cooperative and no distress Lungs: clear to auscultation bilaterally Heart: regular rate and rhythm, S1, S2 normal, no murmur, click, rub or gallop Abdomen: soft, non-tender; bowel sounds normal; no masses,  no organomegaly Extremities: extremities  normal, atraumatic, no cyanosis or edema and Homans sign is negative, no sign of DVT Pulses: 2+ and symmetric Skin: Skin color, texture, turgor normal. No rashes or lesions Neurologic: Alert and oriented X 3, normal strength and tone. Normal symmetric reflexes. Normal coordination and gait  Musculoskeletal:  ROM 0-115, Ligaments intact,  Imaging Review Plain radiographs demonstrate severe degenerative joint disease of the left knee. The overall alignment is significant valgus. The bone quality appears to be good for age and reported activity level.  Assessment/Plan: End stage arthritis, left knee   The patient history, physical examination and imaging studies are consistent with advanced degenerative joint disease of the left knee. The patient has failed conservative treatment.  The clearance notes were reviewed.  After discussion with the patient it was felt that Total Knee Replacement was indicated. The procedure,  risks, and benefits of total knee arthroplasty were presented and reviewed. The risks including but not limited to aseptic loosening, infection, blood clots, vascular injury, stiffness, patella tracking problems complications among others were discussed. The patient acknowledged the explanation, agreed to proceed with the plan.  Sung Parodi 01/29/2014, 6:48 AM

## 2014-01-30 LAB — CBC
HEMATOCRIT: 36 % (ref 36.0–46.0)
Hemoglobin: 12.3 g/dL (ref 12.0–15.0)
MCH: 28.9 pg (ref 26.0–34.0)
MCHC: 34.2 g/dL (ref 30.0–36.0)
MCV: 84.5 fL (ref 78.0–100.0)
Platelets: 383 10*3/uL (ref 150–400)
RBC: 4.26 MIL/uL (ref 3.87–5.11)
RDW: 13.1 % (ref 11.5–15.5)
WBC: 17.3 10*3/uL — AB (ref 4.0–10.5)

## 2014-01-30 LAB — HEMOGLOBIN A1C
Hgb A1c MFr Bld: 6.9 % — ABNORMAL HIGH (ref <5.7)
Mean Plasma Glucose: 151 mg/dL — ABNORMAL HIGH (ref <117)

## 2014-01-30 LAB — GLUCOSE, CAPILLARY
GLUCOSE-CAPILLARY: 194 mg/dL — AB (ref 70–99)
GLUCOSE-CAPILLARY: 217 mg/dL — AB (ref 70–99)
GLUCOSE-CAPILLARY: 146 mg/dL — AB (ref 70–99)
Glucose-Capillary: 174 mg/dL — ABNORMAL HIGH (ref 70–99)

## 2014-01-30 LAB — BASIC METABOLIC PANEL
Anion gap: 14 (ref 5–15)
BUN: 7 mg/dL (ref 6–23)
CHLORIDE: 98 meq/L (ref 96–112)
CO2: 23 mEq/L (ref 19–32)
Calcium: 10.8 mg/dL — ABNORMAL HIGH (ref 8.4–10.5)
Creatinine, Ser: 0.72 mg/dL (ref 0.50–1.10)
GFR calc Af Amer: >90 mL/min (ref >90)
GFR calc non Af Amer: 88 mL/min — ABNORMAL LOW (ref >90)
GLUCOSE: 172 mg/dL — AB (ref 70–99)
Potassium: 4.4 mEq/L (ref 3.7–5.3)
Sodium: 135 mEq/L — ABNORMAL LOW (ref 137–147)

## 2014-01-30 MED ORDER — FONDAPARINUX SODIUM 10 MG/0.8ML ~~LOC~~ SOLN: 10.0000 mg | SUBCUTANEOUS | Status: AC

## 2014-01-30 MED ORDER — OXYCODONE HCL 5 MG PO TABS: 5.0000 mg | ORAL_TABLET | ORAL | Status: AC | PRN

## 2014-01-30 MED ORDER — METHOCARBAMOL 500 MG PO TABS: 500.0000 mg | ORAL_TABLET | Freq: Four times a day (QID) | ORAL | Status: AC | PRN

## 2014-01-30 MED ORDER — ONDANSETRON HCL 4 MG PO TABS: 4.0000 mg | ORAL_TABLET | Freq: Four times a day (QID) | ORAL | Status: AC | PRN

## 2014-01-30 NOTE — Op Note (Signed)
TOTAL KNEE REPLACEMENT OPERATIVE NOTE:  01/29/2014  1:25 PM  PATIENT:  Ashlee Rodriguez  63 y.o. female  PRE-OPERATIVE DIAGNOSIS:  osteoarthritis left knee  POST-OPERATIVE DIAGNOSIS:  Osteoarthritis left knee  PROCEDURE:  Procedure(s): TOTAL KNEE ARTHROPLASTY  SURGEON:  Surgeon(s): Dannielle Huh, MD  PHYSICIAN ASSISTANT: Altamese Cabal, [facility]  ANESTHESIA:   general  DRAINS: Hemovac  SPECIMEN: None  COUNTS:  Correct  TOURNIQUET:  * Missing tourniquet times found for documented tourniquets in log:  [id number] *  DICTATION:  Indication for procedure:    The patient is a 63 y.o. female who has failed conservative treatment for osteoarthritis left knee.  Informed consent was obtained prior to anesthesia. The risks versus benefits of the operation were explain and in a way the patient can, and did, understand.   On the implant demand matching protocol, this patient scored 15.  Therefore, this patient was receive a polyethylene insert with vitamin E which is a high demand implant.  Description of procedure:     The patient was taken to the operating room and placed under anesthesia.  The patient was positioned in the usual fashion taking care that all body parts were adequately padded and/or protected.  I foley catheter was not placed.  A tourniquet was applied and the leg prepped and draped in the usual sterile fashion.  The extremity was exsanguinated with the esmarch and tourniquet inflated to 350 mmHg.  Pre-operative range of motion was normal.  The knee was in 5 degree of mild varus.  A midline incision approximately 6-7 inches long was made with a #10 blade.  A new blade was used to make a parapatellar arthrotomy going 2-3 cm into the quadriceps tendon, over the patella, and alongside the medial aspect of the patellar tendon.  A synovectomy was then performed with the #10 blade and forceps. I then elevated the deep MCL off the medial tibial metaphysis subperiosteally around to the  semimembranosus attachment.    I everted the patella and used calipers to measure patellar thickness.  I used the reamer to ream down to appropriate thickness to recreate the native thickness.  I then removed excess bone with the rongeur and sagittal saw.  I used the appropriately sized template and drilled the three lug holes.  I then put the trial in place and measured the thickness with the calipers to ensure recreation of the native thickness.  The trial was then removed and the patella subluxed and the knee brought into flexion.  A homan retractor was place to retract and protect the patella and lateral structures.  A Z-retractor was place medially to protect the medial structures.  The extra-medullary alignment system was used to make cut the tibial articular surface perpendicular to the anamotic axis of the tibia and in 3 degrees of posterior slope.  The cut surface and alignment jig was removed.  I then used the intramedullary alignment guide to make a 6 valgus cut on the distal femur.  I then marked out the epicondylar axis on the distal femur.  The posterior condylar axis measured 3 degrees.  I then used the anterior referencing sizer and measured the femur to be a size 7.  The 4-In-1 cutting block was screwed into place in external rotation matching the posterior condylar angle, making our cuts perpendicular to the epicondylar axis.  Anterior, posterior and chamfer cuts were made with the sagittal saw.  The cutting block and cut pieces were removed.  A lamina spreader was placed in 90  degrees of flexion.  The ACL, PCL, menisci, and posterior condylar osteophytes were removed.  A 10 mm spacer blocked was found to offer good flexion and extension gap balance after minimal in degree releasing.   The scoop retractor was then placed and the femoral finishing block was pinned in place.  The small sagittal saw was used as well as the lug drill to finish the femur.  The block and cut surfaces were removed  and the medullary canal hole filled with autograft bone from the cut pieces.  The tibia was delivered forward in deep flexion and external rotation.  A size D tray was selected and pinned into place centered on the medial 1/3 of the tibial tubercle.  The reamer and keel was used to prepare the tibia through the tray.    I then trialed with the size 7 femur, size D tibia, a 10 mm insert and the 35 patella.  I had excellent flexion/extension gap balance, excellent patella tracking.  Flexion was full and beyond 120 degrees; extension was zero.  These components were chosen and the staff opened them to me on the back table while the knee was lavaged copiously and the cement mixed.  The soft tissue was infiltrated with 60cc of exparel 1.3% through a 21 gauge needle.  I cemented in the components and removed all excess cement.  The polyethylene tibial component was snapped into place and the knee placed in extension while cement was hardening.  The capsule was infilltrated with 30cc of .25% Marcaine with epinephrine.  A hemovac was place in the joint exiting superolaterally.  A pain pump was place superomedially superficial to the arthrotomy.  Once the cement was hard, the tourniquet was let down.  Hemostasis was obtained.  The arthrotomy was closed with figure-8 #1 vicryl sutures.  The deep soft tissues were closed with #0 vicryls and the subcuticular layer closed with a running #2-0 vicryl.  The skin was reapproximated and closed with skin staples.  The wound was dressed with xeroform, 4 x4's, 2 ABD sponges, a single layer of webril and a TED stocking.   The patient was then awakened, extubated, and taken to the recovery room in stable condition.  BLOOD LOSS:  300cc DRAINS: 1 hemovac, 1 pain catheter COMPLICATIONS:  None.  PLAN OF CARE: Admit to inpatient   PATIENT DISPOSITION:  PACU - hemodynamically stable.   Delay start of Pharmacological VTE agent (>24hrs) due to surgical blood loss or risk of  bleeding:  not applicable  Please fax a copy of this op note to my office at [phone] (please only include page 1 and 2 of the Case Information op note)

## 2014-01-30 NOTE — Evaluation (Signed)
Physical Therapy Evaluation Patient Details Name: Ashlee Rodriguez MRN: [id number] DOB: 1951/01/09 Today's Date: 01/30/2014   History of Present Illness  pt presents with L TKA.    Clinical Impression  Pt agreeable to mobility, but limited by nausea and then became incontinent of urine in standing.  Pt indicates increased pain during mobility along with increased nausea.  Pt needed total A for peri hygiene and cleaning R LE.  RN made aware of L ted hose being wet, as L is over top of dressing.  Will continue to follow.      Follow Up Recommendations Home health PT;Supervision - Intermittent    Equipment Recommendations  None recommended by PT    Recommendations for Other Services       Precautions / Restrictions Precautions Precautions: Fall Restrictions Weight Bearing Restrictions: Yes LLE Weight Bearing: Weight bearing as tolerated      Mobility  Bed Mobility Overal bed mobility: Needs Assistance Bed Mobility: Supine to Sit     Supine to sit: Mod assist;HOB elevated     General bed mobility comments: cues for sequencing.  A with L LE and bringing hips around to EOB.    Transfers Overall transfer level: Needs assistance Equipment used: Rolling walker (2 wheeled) Transfers: Sit to/from [employer] Sit to Stand: Mod assist Stand pivot transfers: Min assist       General transfer comment: cues for UE use, positioning of L LE, and step-by-step through pivot.    Ambulation/Gait                Stairs            Wheelchair Mobility    Modified Rankin (Stroke Patients Only)       Balance Overall balance assessment: Needs assistance Sitting-balance support: Single extremity supported;Feet supported Sitting balance-Leahy Scale: Poor     Standing balance support: Bilateral upper extremity supported Standing balance-Leahy Scale: Poor                               Pertinent Vitals/Pain Pain Assessment: 0-10 Pain  Score: 6  Pain Location: L knee Pain Descriptors / Indicators: Aching Pain Intervention(s): Limited activity within patient's tolerance;Repositioned;Patient requesting pain meds-RN notified    Home Living Family/patient expects to be discharged to:: Private residence Living Arrangements: Spouse/significant other Available Help at Discharge: Family;Available 24 hours/day Type of Home: House Home Access: Stairs to enter Entrance Stairs-Rails: None Entrance Stairs-Number of Steps: 1 and 1 Home Layout: One level Home Equipment: Walker - 2 wheels;Bedside commode      Prior Function Level of Independence: Independent               Hand Dominance        Extremity/Trunk Assessment   Upper Extremity Assessment: Defer to OT evaluation           Lower Extremity Assessment: LLE deficits/detail   LLE Deficits / Details: AAROM ~10 - 65  Cervical / Trunk Assessment: Normal  Communication   Communication: No difficulties  Cognition Arousal/Alertness: Awake/alert Behavior During Therapy: WFL for tasks assessed/performed Overall Cognitive Status: Within Functional Limits for tasks assessed                      General Comments      Exercises        Assessment/Plan    PT Assessment Patient needs continued PT services  PT Diagnosis Difficulty walking;Acute pain  PT Problem List Decreased strength;Decreased range of motion;Decreased activity tolerance;Decreased balance;Decreased mobility;Decreased knowledge of use of DME;Pain  PT Treatment Interventions DME instruction;Gait training;Stair training;Functional mobility training;Therapeutic activities;Therapeutic exercise;Balance training;Patient/family education   PT Goals (Current goals can be found in the Care Plan section) Acute Rehab PT Goals Patient Stated Goal: None stated. PT Goal Formulation: With patient Time For Goal Achievement: 02/06/14 Potential to Achieve Goals: Good    Frequency 7X/week    Barriers to discharge        Co-evaluation               End of Session Equipment Utilized During Treatment: Gait belt Activity Tolerance: Patient limited by pain (Limited by nausea.  ) Patient left: in chair;with call bell/phone within reach;with family/visitor present Nurse Communication: Mobility status         Time: [id number] PT Time Calculation (min): 38 min   Charges:   PT Evaluation $Initial PT Evaluation Tier I: 1 Procedure PT Treatments $Therapeutic Activity: 23-37 mins   PT G CodesSunny Schlein:          Ashlee Rodriguez, Ashlee Rodriguez [phone] 01/30/2014, 8:28 AM

## 2014-01-30 NOTE — Evaluation (Signed)
Occupational Therapy Evaluation Patient Details Name: Ashlee Rodriguez MRN: [id number] DOB: 1951/04/04 Today's Date: 01/30/2014    History of Present Illness pt presents with L TKA.     Clinical Impression   Pt admitted with the above diagnoses and presents with below problem list. Pt will benefit from continued acute OT to address the below listed deficits and maximize independence with basic ADLs prior to d/c home. Pt completed stand pivot transfer from recliner to [facility] with mod A. No ambulation this session. PTA pt was mod I (cane). Currently pt needing min to mod A for LB ADLs. Pt indicated she plans to have home health aide.    Follow Up Recommendations  Supervision/Assistance - 24 hour;No OT follow up    Equipment Recommendations  Other (comment) (Pt has recommended DME. Discussed AE with pt.)    Recommendations for Other Services       Precautions / Restrictions Precautions Precautions: Fall Restrictions Weight Bearing Restrictions: Yes LLE Weight Bearing: Weight bearing as tolerated      Mobility Bed Mobility Overal bed mobility: Needs Assistance Bed Mobility: Supine to Sit     Supine to sit: Mod assist;HOB elevated     General bed mobility comments: pt in recliner  Transfers Overall transfer level: Needs assistance Equipment used: Rolling walker (2 wheeled) Transfers: Sit to/from [employer] Sit to Stand: Mod assist Stand pivot transfers: Mod assist       General transfer comment: assist to power up and steady balance    Balance Overall balance assessment: Needs assistance Sitting-balance support: No upper extremity supported;Feet supported Sitting balance-Leahy Scale: Poor     Standing balance support: Bilateral upper extremity supported;During functional activity Standing balance-Leahy Scale: Poor Standing balance comment: relies heavily on rw                            ADL Overall ADL's : Needs  assistance/impaired Eating/Feeding: Set up;Sitting   Grooming: Set up;Sitting   Upper Body Bathing: Set up;Sitting   Lower Body Bathing: With adaptive equipment;Sit to/from stand;Moderate assistance   Upper Body Dressing : Set up;Sitting   Lower Body Dressing: With adaptive equipment;Sit to/from stand;Moderate assistance   Toilet Transfer: Stand-pivot;BSC;RW;Moderate assistance   Toileting- Clothing Manipulation and Hygiene: Sit to/from stand;Moderate assistance   Tub/ Shower Transfer: 3 in 1;Rolling walker;Stand-pivot;Moderate assistance   Functional mobility during ADLs: Minimal assistance;Rolling walker;Moderate assistance (sit<>stand, stand pivot transfers) General ADL Comments: Educated pt on technique and AE for safe completion of ADLs during recovery. Pt completed  transfer from recliner to [facility] with mod A.      Vision                     Perception     Praxis      Pertinent Vitals/Pain Pain Assessment: 0-10 Pain Score: 5  Pain Location: L knee Pain Descriptors / Indicators: Aching Pain Intervention(s): Limited activity within patient's tolerance;Monitored during session;Premedicated before session     Hand Dominance Right   Extremity/Trunk Assessment Upper Extremity Assessment Upper Extremity Assessment: Overall WFL for tasks assessed;Generalized weakness   Lower Extremity Assessment Lower Extremity Assessment: Defer to PT evaluation LLE Deficits / Details: AAROM ~10 - 65 LLE Coordination: decreased fine motor   Cervical / Trunk Assessment Cervical / Trunk Assessment: Normal   Communication Communication Communication: No difficulties   Cognition Arousal/Alertness: Awake/alert Behavior During Therapy: WFL for tasks assessed/performed Overall Cognitive Status: Within Functional Limits for tasks assessed  General Comments       Exercises       Shoulder Instructions      Home Living Family/patient expects to  be discharged to:: Private residence Living Arrangements: Spouse/significant other Available Help at Discharge: Family;Available 24 hours/day Type of Home: House Home Access: Stairs to enter Enter[employer] CorporationEntrance Stairs-Number of Steps: 1 and 1 Entrance Stairs-Rails: None Home Layout: One level     Bathroom Shower/Tub: Producer, television/film/videoWalk-in shower   Bathroom Toilet: Handicapped height Bathroom Accessibility: Yes How Accessible: Accessible via walker Home Equipment: Walker - 2 wheels;Bedside commode;Adaptive equipment Adaptive Equipment: Other (Comment) (leg lifter)        Prior Functioning/Environment Level of Independence: Independent with assistive device(s) (cane)        Comments: used a cane    OT Diagnosis: Acute pain;Generalized weakness   OT Problem List: Decreased activity tolerance;Decreased strength;Impaired balance (sitting and/or standing);Decreased knowledge of precautions;Decreased knowledge of use of DME or AE;Pain   OT Treatment/Interventions: Self-care/ADL training;Therapeutic exercise;DME and/or AE instruction;Therapeutic activities;Patient/family education;Balance training    OT Goals(Current goals can be found in the care plan section) Acute Rehab OT Goals Patient Stated Goal: walk better. return to volunteer work. OT Goal Formulation: With patient Time For Goal Achievement: 02/06/14 Potential to Achieve Goals: Good ADL Goals Pt Will Perform Lower Body Bathing: with supervision;with adaptive equipment;sit to/from stand Pt Will Perform Lower Body Dressing: with supervision;with adaptive equipment;sit to/from stand Pt Will Transfer to Toilet: with supervision;ambulating (3n1 over toilet) Pt Will Perform Toileting - Clothing Manipulation and hygiene: with supervision;sit to/from stand Pt Will Perform Tub/Shower Transfer: with supervision;ambulating;3 in 1;rolling walker Additional ADL Goal #1: Pt will perform bed mobility at supervision level to prepare for OOB ADLs.  OT  Frequency: Min 3X/week   Barriers to D/C:            Co-evaluation              End of Session Equipment Utilized During Treatment: Gait belt;Rolling walker CPM Left Knee CPM Left Knee: Off Left Knee Flexion (Degrees): 90 Left Knee Extension (Degrees): 0  Activity Tolerance: Patient limited by pain;Patient limited by fatigue Patient left: in chair;with call bell/phone within reach;with nursing/sitter in room;with family/visitor present;Other (comment) (in foot roll)   Time: [id number] OT Time Calculation (min): 34 min Charges:  OT General Charges $OT Visit: 1 Procedure OT Evaluation $Initial OT Evaluation Tier I: 1 Procedure OT Treatments $Self Care/Home Management : 8-22 mins G-Codes:    Pilar GrammesMathews, Wladyslaw Henrichs H 01/30/2014, 9:57 AM

## 2014-01-30 NOTE — Discharge Instructions (Signed)
Diet: As you were doing prior to hospitalization  ° °Activity:  Increase activity slowly as tolerated  °                No lifting or driving for 6 weeks ° °Shower:  May shower without a dressing once there is no drainage from your wound. °                Do NOT wash over the wound. °                °Dressing:  You may change your dressing on Thursday °                   Then change the dressing daily with sterile 4"x4"s gauze dressing  °                   And TED hose for knees. ° °Weight Bearing:  Weight bearing as tolerated as taught in physical therapy.  Use a                                walker or Crutches as instructed. ° °To prevent constipation: you may use a stool softener such as - °              Colace ( over the counter) 100 mg by mouth twice a day  °              Drink plenty of fluids ( prune juice may be helpful) and high fiber foods °               Miralax ( over the counter) for constipation as needed.   ° °Precautions:  If you experience chest pain or shortness of breath - call 911 immediately               For transfer to the hospital emergency department!! °              If you develop a fever greater that 101 F, purulent drainage from wound,                             increased redness or drainage from wound, or calf pain -- Call the office. ° °Follow- Up Appointment:  Please call for an appointment to be seen on 02/13/14 °                                             [place] office:  [phone] °           [address] , [place] [address] °               ° ° °

## 2014-01-30 NOTE — Progress Notes (Signed)
Physical Therapy Treatment Patient Details Name: Ashlee Rodriguez MRN: [id number] DOB: [birth year] Today's Date: 01/30/2014    History of Present Illness pt presents with L TKA.      PT Comments    Pt moving slowly and required 4 standing rest breaks to ambulate 50'.  Feel pt may benefit from staying one more day to improve mobility and balance prior to returning to home with husband.  Will continue to follow.    Follow Up Recommendations  Home health PT;Supervision - Intermittent     Equipment Recommendations  None recommended by PT    Recommendations for Other Services       Precautions / Restrictions Precautions Precautions: Fall Restrictions Weight Bearing Restrictions: Yes LLE Weight Bearing: Weight bearing as tolerated    Mobility  Bed Mobility Overal bed mobility: Needs Assistance Bed Mobility: Supine to Sit;Sit to Supine     Supine to sit: Min assist Sit to supine: Min guard   General bed mobility comments: A with L LE exiting bed, but pt able to bring LE back in to bed, only difficulty with abducting further into bed.    Transfers Overall transfer level: Needs assistance Equipment used: Rolling walker (2 wheeled) Transfers: Sit to/from Stand Sit to Stand: Min assist         General transfer comment: cues for UE use and positioning LEs.    Ambulation/Gait Ambulation/Gait assistance: Min guard Ambulation Distance (Feet): 50 Feet Assistive device: Rolling walker (2 wheeled) Gait Pattern/deviations: Step-to pattern;Decreased step length - right;Decreased stance time - left;Decreased stride length;Trunk flexed   Gait velocity interpretation: Below normal speed for age/gender General Gait Details: pt required 4 standing rest breaks to complete 50' of ambulating.  pt indicates fatigue and weakness limiting mobility.     Stairs Stairs: Yes Stairs assistance: Min assist Stair Management: No rails;Step to pattern;Backwards;With walker Number of Stairs:  1 General stair comments: cues for safe technique and A with balance and management of RW.    Wheelchair Mobility    Modified Rankin (Stroke Patients Only)       Balance Overall balance assessment: Needs assistance Sitting-balance support: No upper extremity supported;Feet supported Sitting balance-Leahy Scale: Fair     Standing balance support: Bilateral upper extremity supported Standing balance-Leahy Scale: Poor                      Cognition Arousal/Alertness: Awake/alert Behavior During Therapy: WFL for tasks assessed/performed Overall Cognitive Status: Within Functional Limits for tasks assessed                      Exercises      General Comments        Pertinent Vitals/Pain Pain Assessment: 0-10 Pain Score: 5  Pain Location: L knee Pain Descriptors / Indicators: Aching Pain Intervention(s): Premedicated before session;Repositioned    Home Living                      Prior Function            PT Goals (current goals can now be found in the care plan section) Acute Rehab PT Goals Patient Stated Goal: walk better. return to volunteer work. PT Goal Formulation: With patient Time For Goal Achievement: 02/06/14 Potential to Achieve Goals: Good Progress towards PT goals: Progressing toward goals    Frequency  7X/week    PT Plan Current plan remains appropriate    Co-evaluation  End of Session Equipment Utilized During Treatment: Gait belt Activity Tolerance: Patient limited by fatigue Patient left: in bed;with call bell/phone within reach     Time: 1315-1352 PT Time Calculation (min): 37 min  Charges:  $Gait Training: 23-37 mins                    G CodesSunny Rodriguez:      Ashlee Rodriguez F, [place]PT [phone] 01/30/2014, 2:15 PM

## 2014-01-30 NOTE — Care Management Note (Signed)
CARE MANAGEMENT NOTE 01/30/2014  Patient:  Ashlee Rodriguez,[name]   Account Number:  [id number]  Date Initiated:  01/30/2014  Documentation initiated by:  Vance PeperBRADY,Hero Kulish  Subjective/Objective Assessment:   63 yr old female s/p left total knee arthroplasty.     Action/Plan:   Case manager spoke with patient and husband concerning home health and DME needs at discharge. [facility] no changes.HAs family support at discharge.   Anticipated DC Date:  01/31/2014   Anticipated DC Plan:  HOME W HOME HEALTH SERVICES      DC Planning Services  CM consult      [facility]  DURABLE MEDICAL EQUIPMENT   Choice offered to / List presented to:  C-1 Patient   DME arranged  WALKER - ROLLING  3-N-1  CPM      DME agency  [employer]     HH arranged  HH-2 PT  HH-4 NURSE'S AIDE      HH agency  [facility]   Status of service:  Completed, signed off Medicare Important Message given?  NA - LOS <3 / Initial given by admissions (If response is "NO", the following Medicare IM given date fields will be blank) Date Medicare IM given:   Medicare IM given by:   Date Additional Medicare IM given:   Additional Medicare IM given by:    Discharge Disposition:  HOME W HOME HEALTH SERVICES  Per UR Regulation:  Reviewed for med. necessity/level of care/duration of stay

## 2014-01-30 NOTE — Progress Notes (Signed)
SPORTS MEDICINE AND JOINT REPLACEMENT  Georgena Spurling, MD   Altamese Cabal, PA-C [address] [place], [place], [place]  [address]                             [phone]   PROGRESS NOTE  Subjective:  negative for Chest Pain  negative for Shortness of Breath  positive for Nausea/Vomiting   negative for Calf Pain  negative for Bowel Movement   Tolerating Diet: yes         Patient reports pain as 4 on 0-10 scale.    Objective: Vital signs in last 24 hours:   Patient Vitals for the past 24 hrs:  BP Temp Temp src Pulse Resp SpO2 Height Weight  01/30/14 0447 190/92 mmHg 99.3 F (37.4 C) Oral 88 16 100 % - -  01/30/14 0400 - - - - 16 100 % - -  01/30/14 0112 187/91 mmHg 98.4 F (36.9 C) Oral 88 16 98 % - -  01/29/14 2352 - - - - 16 100 % - -  01/29/14 2100 - - - - - - 5\' 8"  (1.727 m) 119.296 kg (263 lb)  01/29/14 2048 159/87 mmHg 99 F (37.2 C) - 97 16 100 % - -  01/29/14 2000 - - - - 16 100 % - -  01/29/14 1609 175/80 mmHg 97.8 F (36.6 C) - 88 16 93 % - -  01/29/14 1530 176/80 mmHg 98.1 F (36.7 C) - 87 18 98 % - -  01/29/14 1515 176/84 mmHg - - 82 14 100 % - -  01/29/14 1500 176/84 mmHg - - 86 21 100 % - -  01/29/14 1445 172/85 mmHg - - 83 18 100 % - -  01/29/14 1430 174/85 mmHg - - 86 19 100 % - -  01/29/14 1415 169/88 mmHg - - 85 22 98 % - -  01/29/14 1403 160/81 mmHg 97.6 F (36.4 C) - 89 19 88 % - -  01/29/14 1035 - - - 61 20 98 % - -  01/29/14 1034 - - - 63 20 99 % - -  01/29/14 1033 127/52 mmHg - - 61 18 100 % - -  01/29/14 1032 - - - 62 20 98 % - -  01/29/14 1031 - - - 61 22 98 % - -  01/29/14 1030 - - - 62 22 98 % - -  01/29/14 1029 - - - 64 22 98 % - -  01/29/14 1028 131/50 mmHg - - 64 21 98 % - -  01/29/14 1027 - - - 63 22 99 % - -  01/29/14 1026 - - - 64 21 98 % - -  01/29/14 1025 131/60 mmHg - - 66 22 98 % - -  01/29/14 1023 - - - 66 21 98 % - -  01/29/14 1022 - - - 68 21 99 % - -  01/29/14 1021 - - - 68 21 99 % - -  01/29/14 1020 - - - 66 17 100 % -  -  01/29/14 1019 - - - 63 18 100 % - -  01/29/14 1018 145/53 mmHg - - 70 18 100 % - -  01/29/14 1017 - - - 65 18 99 % - -  01/29/14 1016 - - - 66 18 100 % - -  01/29/14 1015 - - - 69 21 99 % - -  01/29/14 1014 - - - 66  22 98 % - -  01/29/14 1013 147/55 mmHg - - 69 23 99 % - -  01/29/14 1012 - - - 69 17 99 % - -  01/29/14 1011 - - - 68 21 100 % - -  01/29/14 1010 156/63 mmHg - - 71 18 100 % - -  01/29/14 1008 - - - 73 18 100 % - -  01/29/14 1007 - - - 73 23 99 % - -  01/29/14 1006 - - - 67 21 100 % - -  01/29/14 1005 204/70 mmHg - - 74 20 100 % - -  01/29/14 1004 - - - 70 18 100 % - -  01/29/14 1003 - - - 81 22 100 % - -  01/29/14 1002 - - - 88 17 100 % - -  01/29/14 1001 - - - 87 17 100 % - -  01/29/14 1000 186/76 mmHg - - 87 16 100 % - -  01/29/14 0959 - - - 82 19 100 % - -  01/29/14 0958 - - - 77 19 100 % - -  01/29/14 0957 - - - 86 15 100 % - -  01/29/14 0956 - - - 75 19 100 % - -  01/29/14 0955 203/78 mmHg - - 76 17 100 % - -  01/29/14 0954 - - - 80 18 100 % - -  01/29/14 0953 - - - 80 20 100 % - -  01/29/14 0952 - - - 87 22 100 % - -  01/29/14 0950 155/80 mmHg - - 73 18 100 % - -  01/29/14 0945 167/67 mmHg - - 57 19 99 % - -  01/29/14 0944 - - - 62 21 100 % - -  01/29/14 0943 167/67 mmHg - - 62 20 - - -  01/29/14 0856 145/72 mmHg 98.5 F (36.9 C) Oral 62 18 99 % - 120.657 kg (266 lb)    @flow {1959:LAST@   Intake/Output from previous day:   08/17 0701 - 08/18 0700 In: 2013.8 [P.O.:480; I.V.:1533.8] Out: 350 [Drains:250]   Intake/Output this shift:       Intake/Output     08/17 0701 - 08/18 0700 08/18 0701 - 08/19 0700   P.O. 480    I.V. (mL/kg) 1533.8 (12.9)    Total Intake(mL/kg) 2013.8 (16.9)    Drains 250    Blood 100    Total Output 350     Net +1663.8          Urine Occurrence 4 x       LABORATORY DATA:  Recent Labs  01/29/14 1820 01/30/14 0520  WBC 18.6* 17.3*  HGB 13.6 12.3  HCT 39.5 36.0  PLT 358 383    Recent Labs  01/29/14 1820  01/30/14 0520  NA  --  135*  K  --  4.4  CL  --  98  CO2  --  23  BUN  --  7  CREATININE 0.79 0.72  GLUCOSE  --  172*  CALCIUM  --  10.8*   Lab Results  Component Value Date   INR 1.03 01/19/2014    Examination:  General appearance: alert, cooperative and no distress Extremities: Homans sign is negative, no sign of DVT  Wound Exam: clean, dry, intact   Drainage:  Scant/small amount Serosanguinous exudate  Motor Exam: EHL and FHL Intact  Sensory Exam: Deep Peroneal normal   Assessment:    1 Day Post-Op  Procedure(s) (LRB): TOTAL KNEE ARTHROPLASTY (Left)  ADDITIONAL DIAGNOSIS:  Active Problems:   S/P total knee replacement using cement  Acute Blood Loss Anemia   Plan: Physical Therapy as ordered Weight Bearing as Tolerated (WBAT)  DVT Prophylaxis:  Lovenox  DISCHARGE PLAN: Home  DISCHARGE NEEDS: HHPT, CPM, Walker and 3-in-1 comode seat         Io Dieujuste 01/30/2014, 8:24 AM

## 2014-01-31 ENCOUNTER — Encounter (HOSPITAL_COMMUNITY): Payer: Self-pay | Admitting: Orthopedic Surgery

## 2014-01-31 LAB — CBC
HCT: 33.2 % — ABNORMAL LOW (ref 36.0–46.0)
Hemoglobin: 11.4 g/dL — ABNORMAL LOW (ref 12.0–15.0)
MCH: 28.9 pg (ref 26.0–34.0)
MCHC: 34.3 g/dL (ref 30.0–36.0)
MCV: 84.3 fL (ref 78.0–100.0)
PLATELETS: 352 10*3/uL (ref 150–400)
RBC: 3.94 MIL/uL (ref 3.87–5.11)
RDW: 13.1 % (ref 11.5–15.5)
WBC: 13 10*3/uL — AB (ref 4.0–10.5)

## 2014-01-31 LAB — GLUCOSE, CAPILLARY
GLUCOSE-CAPILLARY: 177 mg/dL — AB (ref 70–99)
Glucose-Capillary: 189 mg/dL — ABNORMAL HIGH (ref 70–99)

## 2014-01-31 NOTE — Progress Notes (Signed)
Patient provided with discharge instructions and follow up information. Per discussion with Everlean AlstromMaurice PA, due to financial constraints patient will be going home on Aspirin 325 mg once a day for a month for VTE. Bedside education done on the importance of taking this medication along with movement. [facility] and contact information given. She is going home at this time with her husband.

## 2014-01-31 NOTE — Progress Notes (Signed)
Occupational Therapy Treatment Patient Details Name: Ashlee Rodriguez MRN: [id number] DOB: 04/27/1951 Today's Date: 01/31/2014    History of present illness pt presents with L TKA.     OT comments  Education provided during session and pt progressing. Practiced shower transfer. Pt verbalizes understanding of information covered by OT.  Follow Up Recommendations  Supervision/Assistance - 24 hour;No OT follow up    Equipment Recommendations  None recommended by OT    Recommendations for Other Services      Precautions / Restrictions Precautions Precautions: Fall Restrictions Weight Bearing Restrictions: Yes LLE Weight Bearing: Weight bearing as tolerated       Mobility Bed Mobility Overal bed mobility: Needs Assistance Bed Mobility: Sit to Supine;Supine to Sit     Supine to sit: Supervision Sit to supine: Supervision   General bed mobility comments: cues to scoot closer to [facility] before sitting. Encouraged pt to try without rails and also practiced with bed flat  Transfers Overall transfer level: Needs assistance Equipment used: Rolling walker (2 wheeled) Transfers: Sit to/from Stand Sit to Stand: Min guard         General transfer comment: Min guard for safety.    Balance                                   ADL Overall ADL's : Needs assistance/impaired                         Toilet Transfer: Min guard;Ambulation;RW;BSC       Tub/ Shower Transfer: Min guard;Ambulation;3 in 1;Rolling walker;Grab bars   Functional mobility during ADLs: Min guard;Rolling walker General ADL Comments: Discussed use of reacher. Educated on use of bag on walker to carry items. Educated on dressing technique-pt did not want to practice LB dressing as family plans to assist her. Educated on safey tips (sitting for LB ADLs, rugs, safe shoewear, pet). Recommended spouse be with her for shower transfer. Practiced shower transfer. Educated on benefit of trying to  reach left foot for ADLs as it allows knee to bend.       Vision                     Perception     Praxis      Cognition   Behavior During Therapy: WFL for tasks assessed/performed Overall Cognitive Status: Within Functional Limits for tasks assessed                       Extremity/Trunk Assessment                  Shoulder Instructions       General Comments      Pertinent Vitals/ Pain       Pain Assessment: 0-10 Pain Score: 6  Pain Location: left knee Pain Descriptors / Indicators: Throbbing Pain Intervention(s): Repositioned  Home Living                                          Prior Functioning/Environment              Frequency Min 3X/week     Progress Toward Goals  OT Goals(current goals can now be found in the care plan section)  Progress towards OT goals: Progressing  toward goals  Acute Rehab OT Goals Patient Stated Goal: not stated OT Goal Formulation: With patient Time For Goal Achievement: 02/06/14 Potential to Achieve Goals: Good ADL Goals Pt Will Perform Lower Body Bathing: with supervision;with adaptive equipment;sit to/from stand Pt Will Perform Lower Body Dressing: with supervision;with adaptive equipment;sit to/from stand Pt Will Transfer to Toilet: with supervision;ambulating (3 in 1 over toilet) Pt Will Perform Toileting - Clothing Manipulation and hygiene: with supervision;sit to/from stand Pt Will Perform Tub/Shower Transfer: with supervision;ambulating;3 in 1;rolling walker Additional ADL Goal #1: Pt will perform bed mobility at supervision level to prepare for OOB ADLs.  Plan Discharge plan remains appropriate    Co-evaluation                 End of Session Equipment Utilized During Treatment: Gait belt;Rolling walker CPM Left Knee CPM Left Knee: Off   Activity Tolerance Patient tolerated treatment well   Patient Left in bed;with call bell/phone within reach;with  family/visitor present   Nurse Communication          Time: 1610-96040856-0923 OT Time Calculation (min): 27 min  Charges: OT General Charges $OT Visit: 1 Procedure OT Treatments $Self Care/Home Management : 8-22 mins $Therapeutic Activity: 8-22 mins  Earlie RavelingStraub, Mariabella Nilsen L OTR/L [phone] 01/31/2014, 10:50 AM

## 2014-01-31 NOTE — Progress Notes (Signed)
Physical Therapy Treatment Patient Details Name: Ashlee Rodriguez MRN: [id number] DOB: 1951/04/18 Today's Date: 01/31/2014    History of Present Illness pt presents with L TKA.      PT Comments    Pt with improved mobility today, though continues to fatigue easily.  Feel pt able to D/C to home today from PT stand point.  Will continue to follow if remains on acute.    Follow Up Recommendations  Home health PT;Supervision - Intermittent     Equipment Recommendations  None recommended by PT    Recommendations for Other Services       Precautions / Restrictions Precautions Precautions: Fall Restrictions Weight Bearing Restrictions: Yes LLE Weight Bearing: Weight bearing as tolerated    Mobility  Bed Mobility Overal bed mobility: Needs Assistance Bed Mobility: Supine to Sit     Supine to sit: Min assist     General bed mobility comments: A with bringing L LE OOB.    Transfers Overall transfer level: Needs assistance Equipment used: Rolling walker (2 wheeled) Transfers: Sit to/from Stand Sit to Stand: Min guard         General transfer comment: Demos good technique.    Ambulation/Gait Ambulation/Gait assistance: Min guard Ambulation Distance (Feet): 100 Feet Assistive device: Rolling walker (2 wheeled) Gait Pattern/deviations: Step-to pattern;Decreased step length - right;Decreased stance time - left;Decreased stride length;Trunk flexed   Gait velocity interpretation: Below normal speed for age/gender General Gait Details: pt required 6 standing rest breaks for 100' today.  pt indicates feeling better today, but still fatigues easily with gait.     Stairs            Wheelchair Mobility    Modified Rankin (Stroke Patients Only)       Balance                                    Cognition Arousal/Alertness: Awake/alert Behavior During Therapy: WFL for tasks assessed/performed Overall Cognitive Status: Within Functional Limits for  tasks assessed                      Exercises Total Joint Exercises Ankle Circles/Pumps: AROM;Both;10 reps Long Arc Quad: AAROM;Left;10 reps Knee Flexion: AAROM;Left;10 reps Goniometric ROM: ~ 10 - 70    General Comments        Pertinent Vitals/Pain Pain Assessment: 0-10 Pain Score: 4  Pain Location: L knee Pain Descriptors / Indicators: Tightness Pain Intervention(s): Premedicated before session;Repositioned    Home Living                      Prior Function            PT Goals (current goals can now be found in the care plan section) Acute Rehab PT Goals Patient Stated Goal: walk better. return to volunteer work. PT Goal Formulation: With patient Time For Goal Achievement: 02/06/14 Potential to Achieve Goals: Good Progress towards PT goals: Progressing toward goals    Frequency  7X/week    PT Plan Current plan remains appropriate    Co-evaluation             End of Session Equipment Utilized During Treatment: Gait belt Activity Tolerance: Patient limited by fatigue Patient left: in chair;with call bell/phone within reach     Time: [id number] PT Time Calculation (min): 35 min  Charges:  $Gait Training: 8-22 mins $Therapeutic Exercise: 8-22 mins  G CodesSunny Rodriguez, [place] [phone] 01/31/2014, 9:32 AM

## 2014-02-07 NOTE — Discharge Summary (Signed)
SPORTS MEDICINE & JOINT REPLACEMENT   Georgena Spurling, MD   Altamese Cabal, PA-C [address] [place], [place], [place]  [address]                             [phone]  PATIENT ID: Ashlee Rodriguez        MRN:  [id number]          DOB/AGE: August 28, 1950 / 63 y.o.    DISCHARGE SUMMARY  ADMISSION DATE:    01/29/2014 DISCHARGE DATE:  01/31/2014  ADMISSION DIAGNOSIS: osteoarthritis left knee    DISCHARGE DIAGNOSIS:  osteoarthritis left knee    ADDITIONAL DIAGNOSIS: Active Problems:   S/P total knee replacement using cement  Past Medical History  Diagnosis Date  . History of stress test     & echocardiogram in [place]   . H/O Legionnaire's disease 2013  . Thyroid goiter 01/10/2014    biopsy done & benign  . Diabetes mellitus without complication     Type II  . Arthritis     knees, fingers- OA  . Hypertension     PROCEDURE: Procedure(s): TOTAL KNEE ARTHROPLASTY on 01/29/2014  CONSULTS:     HISTORY: See H&P in chart  HOSPITAL COURSE:  Ashlee Rodriguez is a 63 y.o. admitted on 01/29/2014 and found to have a diagnosis of osteoarthritis left knee.  After appropriate laboratory studies were obtained  they were taken to the operating room on 01/29/2014 and underwent Procedure(s): TOTAL KNEE ARTHROPLASTY.   They were given perioperative antibiotics:  Anti-infectives   Start     Dose/Rate Route Frequency Ordered Stop   01/29/14 1700  ceFAZolin (ANCEF) IVPB 2 g/50 mL premix     2 g 100 mL/hr over 30 Minutes Intravenous Every 6 hours 01/29/14 1649 01/29/14 2308   01/29/14 0600  ceFAZolin (ANCEF) 3 g in dextrose 5 % 50 mL IVPB     3 g 160 mL/hr over 30 Minutes Intravenous On call to O.R. 01/28/14 1345 01/29/14 1150    .  Tolerated the procedure well.  Placed with a foley intraoperatively.  Given Ofirmev at induction and for 48 hours.    POD# 1: Vital signs were stable.  Patient denied Chest pain, shortness of breath, or calf pain.  Patient was started on Lovenox 30 mg  subcutaneously twice daily at 8am.  Consults to PT, OT, and care management were made.  The patient was weight bearing as tolerated.  CPM was placed on the operative leg 0-90 degrees for 6-8 hours a day.  Incentive spirometry was taught.  Dressing was changed.  Hemovac was discontinued.      POD #2, Continued  PT for ambulation and exercise program.  IV saline locked.  O2 discontinued.   The remainder of the hospital course was dedicated to ambulation and strengthening.   The patient was discharged on 2 days post op in  Good condition.  Blood products given:none  DIAGNOSTIC STUDIES: Recent vital signs: No data found.      Recent laboratory studies: No results found for this basename: WBC, HGB, HCT, PLT,  in the last 168 hours No results found for this basename: NA, K, CL, CO2, BUN, CREATININE, GLUCOSE, CALCIUM,  in the last 168 hours Lab Results  Component Value Date   INR 1.03 01/19/2014     Recent Radiographic Studies :  Dg Chest 2 View  01/19/2014   CLINICAL DATA:  Preoperative respiratory exam for knee arthroplasty.  EXAM: CHEST - 2 VIEW  COMPARISON:  None  FINDINGS: The heart size and mediastinal contours are within normal limits. There is no evidence of pulmonary edema, consolidation, pneumothorax, nodule or pleural fluid. Mild degenerative changes present of the thoracic spine.  IMPRESSION: No active disease.   Electronically Signed   By: Irish Lack M.D.   On: 01/19/2014 14:31    DISCHARGE INSTRUCTIONS: Discharge Instructions   CPM    Complete by:  As directed   Continuous passive motion machine (CPM):      Use the CPM from 0 to 90 for 6-8 hours per day.      You may increase by 10 per day.  You may break it up into 2 or 3 sessions per day.      Use CPM for 2 weeks or until you are told to stop.     Call MD / Call 911    Complete by:  As directed   If you experience chest pain or shortness of breath, CALL 911 and be transported to the hospital emergency room.  If you  develope a fever above 101 F, pus (white drainage) or increased drainage or redness at the wound, or calf pain, call your surgeon's office.     Change dressing    Complete by:  As directed   Change dressing on Thursday, then change the dressing daily with sterile 4 x 4 inch gauze dressing and apply TED hose.     Constipation Prevention    Complete by:  As directed   Drink plenty of fluids.  Prune juice may be helpful.  You may use a stool softener, such as Colace (over the counter) 100 mg twice a day.  Use MiraLax (over the counter) for constipation as needed.     Diet - low sodium heart healthy    Complete by:  As directed      Do not put a pillow under the knee. Place it under the heel.    Complete by:  As directed      Driving restrictions    Complete by:  As directed   No driving for 6 weeks     Increase activity slowly as tolerated    Complete by:  As directed      Lifting restrictions    Complete by:  As directed   No lifting for 6 weeks     TED hose    Complete by:  As directed   Use stockings (TED hose) for 2 weeks on both leg(s).  You may remove them at night for sleeping.           DISCHARGE MEDICATIONS:     Medication List         amLODipine 10 MG tablet  Commonly known as:  NORVASC  Take 10 mg by mouth daily with breakfast.     aspirin 81 MG tablet  Take 81 mg by mouth daily.     cinacalcet 30 MG tablet  Commonly known as:  SENSIPAR  Take 30 mg by mouth 2 (two) times daily with a meal.     cloNIDine 0.3 MG tablet  Commonly known as:  CATAPRES  Take 0.3 mg by mouth 2 (two) times daily.     fondaparinux 10 MG/0.8ML Soln injection  Commonly known as:  ARIXTRA  Inject 0.8 mLs (10 mg total) into the skin daily.     glimepiride 4 MG tablet  Commonly known as:  AMARYL  Take 4 mg by  mouth 2 (two) times daily.     metFORMIN 1000 MG tablet  Commonly known as:  GLUCOPHAGE  Take 1,000 mg by mouth 2 (two) times daily with a meal.     methocarbamol 500 MG  tablet  Commonly known as:  ROBAXIN  Take 1-2 tablets (500-1,000 mg total) by mouth every 6 (six) hours as needed for muscle spasms.     ondansetron 4 MG tablet  Commonly known as:  ZOFRAN  Take 1 tablet (4 mg total) by mouth every 6 (six) hours as needed for nausea.     oxyCODONE 5 MG immediate release tablet  Commonly known as:  Oxy IR/ROXICODONE  Take 1-2 tablets (5-10 mg total) by mouth every 3 (three) hours as needed for breakthrough pain.     pravastatin 20 MG tablet  Commonly known as:  PRAVACHOL  Take 20 mg by mouth every evening.     quinapril 40 MG tablet  Commonly known as:  ACCUPRIL  Take 40 mg by mouth daily with breakfast.     sitaGLIPtin 100 MG tablet  Commonly known as:  JANUVIA  Take 100 mg by mouth daily.        FOLLOW UP VISIT:       Follow-up Information   Follow up with [facility]. (Someone from [facility] will contact you concerning start date and time for physical therapy.)    Specialty:  Home Health Services   Contact information:   [address] [place] [address] [phone]       Follow up with Raymon Mutton, MD. Call on 02/13/2014.   Specialty:  Orthopedic Surgery   Contact information:   [address] [place] [address] [phone]       DISPOSITION: HOME CONDITION:  Good   Darell Saputo 02/07/2014, 10:54 AM

## 2016-04-06 IMAGING — CR DG CHEST 2V
2 series · 2 of 2 positions shown · non-contrast
Comparison: None

CLINICAL DATA: Preoperative respiratory exam for knee arthroplasty.

EXAM:
CHEST - 2 VIEW

[w chest pa]
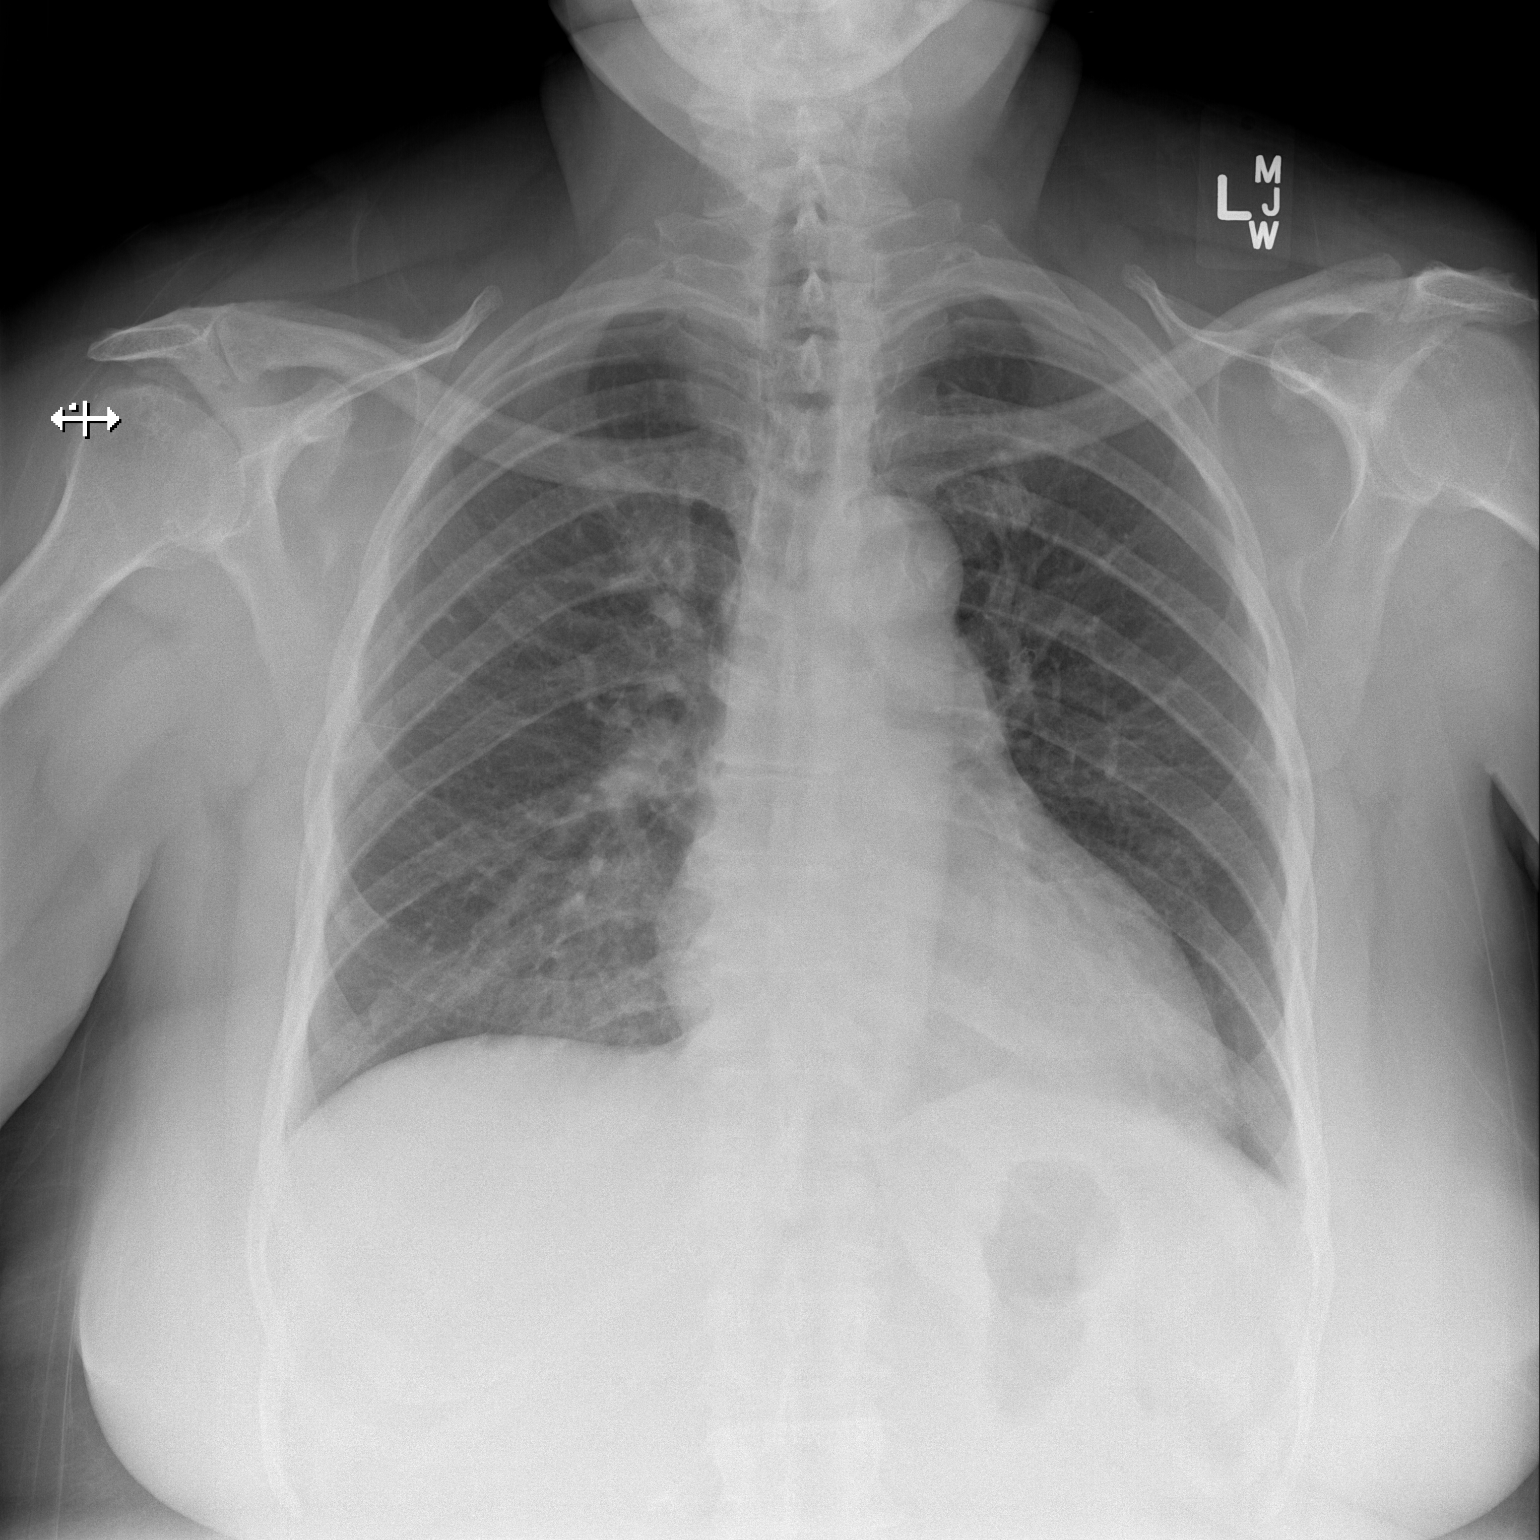

[w chest lat]
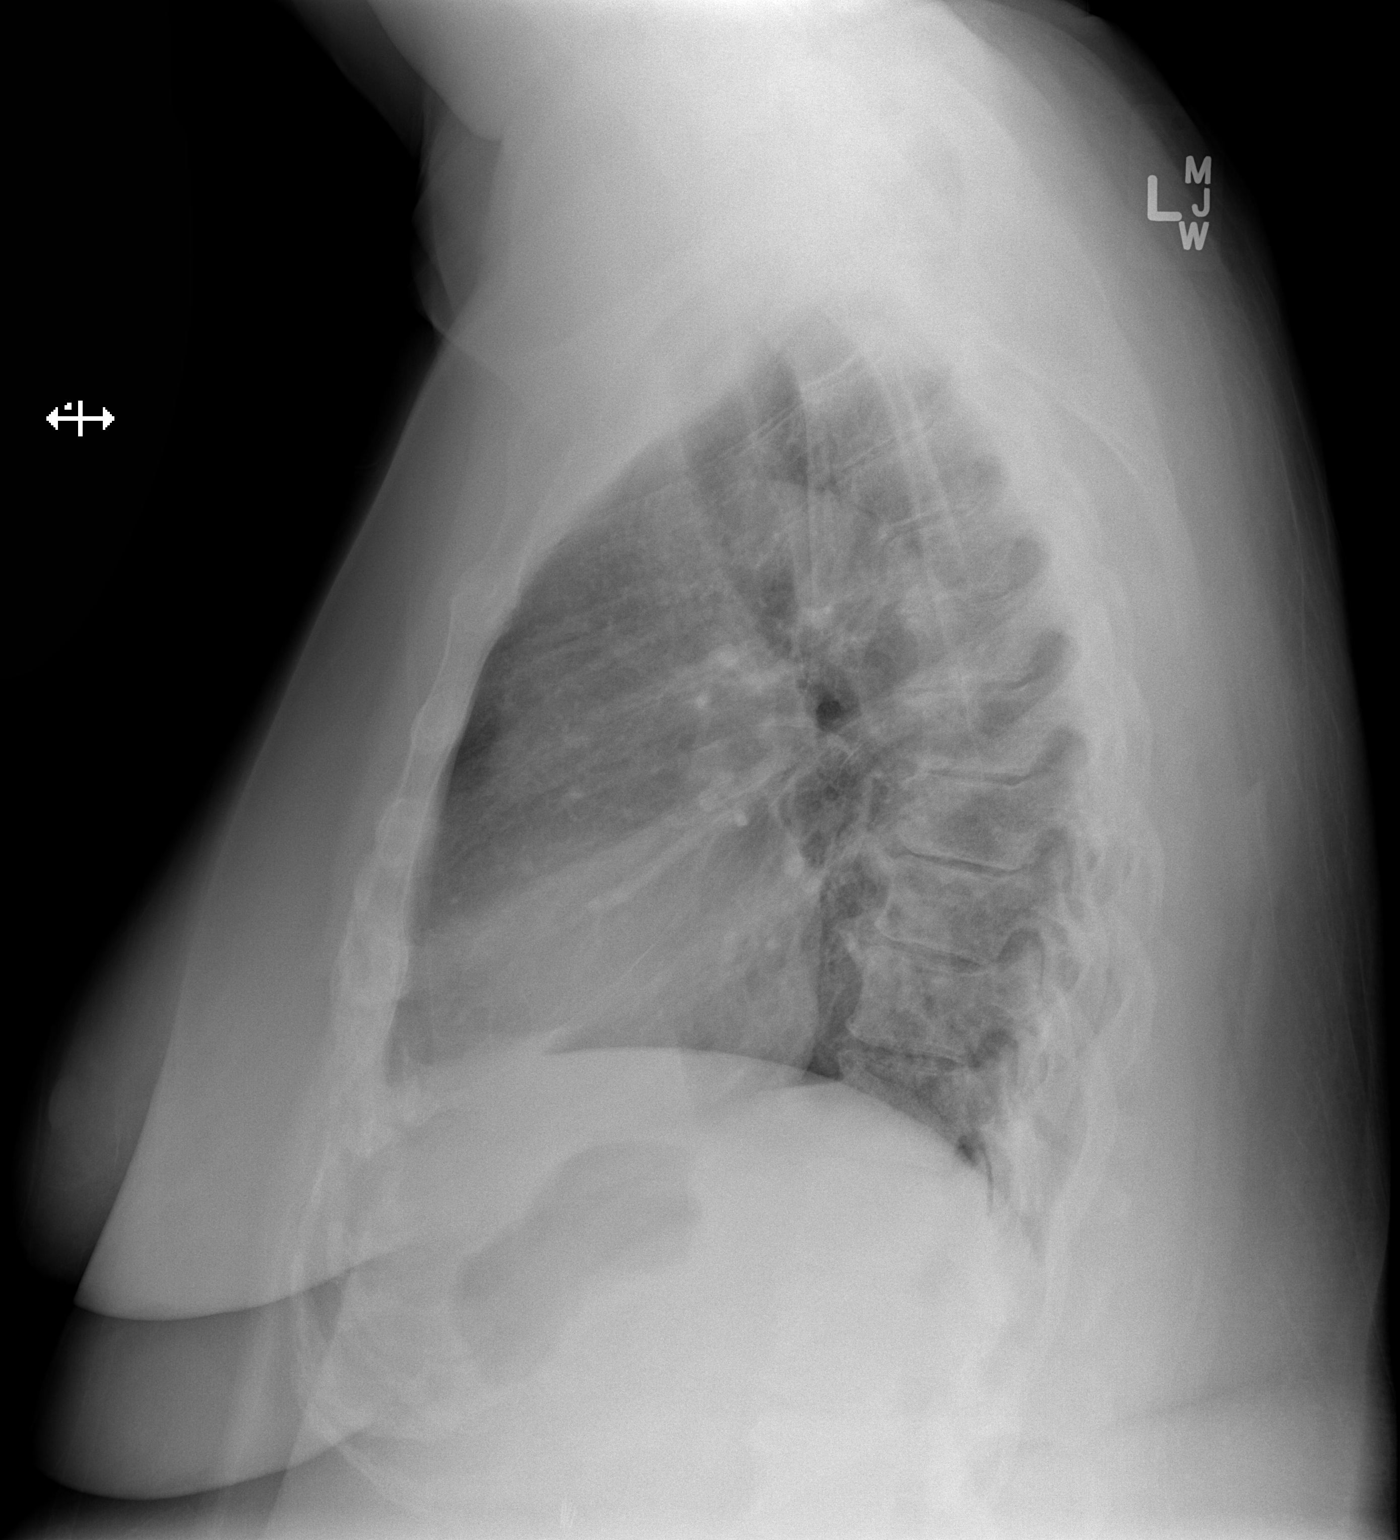

[2 of 2 positions shown; findings below may reference images not displayed]

FINDINGS: The heart size and mediastinal contours are within normal limits.
There is no evidence of pulmonary edema, consolidation,
pneumothorax, nodule or pleural fluid. Mild degenerative changes
present of the thoracic spine.
IMPRESSION: No active disease.

## 2016-11-12 ENCOUNTER — Other Ambulatory Visit: Payer: Self-pay | Admitting: Orthopedic Surgery

## 2017-03-08 ENCOUNTER — Ambulatory Visit (HOSPITAL_COMMUNITY): Admission: RE | Admit: 2017-03-08 | Payer: Medicare HMO | Source: Ambulatory Visit | Admitting: Orthopedic Surgery

## 2017-03-08 ENCOUNTER — Encounter (HOSPITAL_COMMUNITY): Admission: RE | Payer: Self-pay | Source: Ambulatory Visit

## 2017-03-08 SURGERY — ARTHROPLASTY, KNEE, TOTAL
Anesthesia: Spinal | Site: Knee | Laterality: Right
